# Patient Record
Sex: Female | Born: 1955
Health system: Southern US, Community
[De-identification: ages and names within clinical notes are randomized; demographics above are authoritative.]

## PROBLEM LIST (undated history)

## (undated) DIAGNOSIS — M1990 Unspecified osteoarthritis, unspecified site: Secondary | ICD-10-CM

## (undated) DIAGNOSIS — E0500 Thyrotoxicosis with diffuse goiter without thyrotoxic crisis or storm: Secondary | ICD-10-CM

## (undated) HISTORY — DX: Unspecified osteoarthritis, unspecified site: M19.90

## (undated) HISTORY — PX: SHOULDER SURGERY: SHX246

## (undated) HISTORY — DX: Thyrotoxicosis with diffuse goiter without thyrotoxic crisis or storm: E05.00

---

## 1992-10-06 HISTORY — PX: TUBAL LIGATION: SHX77

## 1998-04-16 ENCOUNTER — Other Ambulatory Visit: Admission: RE | Admit: 1998-04-16 | Discharge: 1998-04-16 | Payer: Self-pay | Admitting: Obstetrics and Gynecology

## 1999-05-20 ENCOUNTER — Other Ambulatory Visit: Admission: RE | Admit: 1999-05-20 | Discharge: 1999-05-20 | Payer: Self-pay | Admitting: Obstetrics and Gynecology

## 2000-06-23 ENCOUNTER — Other Ambulatory Visit: Admission: RE | Admit: 2000-06-23 | Discharge: 2000-06-23 | Payer: Self-pay | Admitting: Obstetrics and Gynecology

## 2001-06-28 ENCOUNTER — Other Ambulatory Visit: Admission: RE | Admit: 2001-06-28 | Discharge: 2001-06-28 | Payer: Self-pay | Admitting: Obstetrics and Gynecology

## 2002-06-30 ENCOUNTER — Other Ambulatory Visit: Admission: RE | Admit: 2002-06-30 | Discharge: 2002-06-30 | Payer: Self-pay | Admitting: Obstetrics and Gynecology

## 2003-07-04 ENCOUNTER — Other Ambulatory Visit: Admission: RE | Admit: 2003-07-04 | Discharge: 2003-07-04 | Payer: Self-pay | Admitting: Obstetrics and Gynecology

## 2004-07-05 ENCOUNTER — Other Ambulatory Visit: Admission: RE | Admit: 2004-07-05 | Discharge: 2004-07-05 | Payer: Self-pay | Admitting: Obstetrics and Gynecology

## 2004-12-02 ENCOUNTER — Encounter: Admission: RE | Admit: 2004-12-02 | Discharge: 2004-12-02 | Payer: Self-pay | Admitting: Orthopedic Surgery

## 2005-07-08 ENCOUNTER — Other Ambulatory Visit: Admission: RE | Admit: 2005-07-08 | Discharge: 2005-07-08 | Payer: Self-pay | Admitting: Obstetrics and Gynecology

## 2005-10-06 HISTORY — PX: COLONOSCOPY: SHX174

## 2006-08-18 ENCOUNTER — Ambulatory Visit: Payer: Self-pay | Admitting: Internal Medicine

## 2006-09-01 ENCOUNTER — Encounter (INDEPENDENT_AMBULATORY_CARE_PROVIDER_SITE_OTHER): Payer: Self-pay | Admitting: Specialist

## 2006-09-01 ENCOUNTER — Ambulatory Visit: Payer: Self-pay | Admitting: Internal Medicine

## 2009-11-17 ENCOUNTER — Emergency Department (HOSPITAL_BASED_OUTPATIENT_CLINIC_OR_DEPARTMENT_OTHER): Admission: EM | Admit: 2009-11-17 | Discharge: 2009-11-17 | Payer: Self-pay | Admitting: Emergency Medicine

## 2009-11-17 ENCOUNTER — Ambulatory Visit: Payer: Self-pay | Admitting: Diagnostic Radiology

## 2010-12-26 LAB — URINALYSIS, ROUTINE W REFLEX MICROSCOPIC
Bilirubin Urine: NEGATIVE
Glucose, UA: NEGATIVE mg/dL
Hgb urine dipstick: NEGATIVE
Nitrite: NEGATIVE
Specific Gravity, Urine: 1.023 (ref 1.005–1.030)
Urobilinogen, UA: 0.2 mg/dL (ref 0.0–1.0)

## 2010-12-26 LAB — BASIC METABOLIC PANEL
CO2: 26 mEq/L (ref 19–32)
Calcium: 9.6 mg/dL (ref 8.4–10.5)
Creatinine, Ser: 0.9 mg/dL (ref 0.4–1.2)
GFR calc Af Amer: >60 mL/min (ref >60)
GFR calc non Af Amer: >60 mL/min (ref >60)
Potassium: 4.4 mEq/L (ref 3.5–5.1)
Sodium: 140 mEq/L (ref 135–145)

## 2010-12-26 LAB — DIFFERENTIAL
Basophils Absolute: 0.2 10*3/uL — ABNORMAL HIGH (ref 0.0–0.1)
Basophils Relative: 2 % — ABNORMAL HIGH (ref 0–1)
Eosinophils Absolute: 0 10*3/uL (ref 0.0–0.7)
Eosinophils Relative: 0 % (ref 0–5)
Lymphs Abs: 0.2 10*3/uL — ABNORMAL LOW (ref 0.7–4.0)
Monocytes Absolute: 0.4 10*3/uL (ref 0.1–1.0)
Monocytes Relative: 3 % (ref 3–12)
Neutro Abs: 11.4 10*3/uL — ABNORMAL HIGH (ref 1.7–7.7)

## 2010-12-26 LAB — CBC
Hemoglobin: 13.4 g/dL (ref 12.0–15.0)
MCHC: 33.9 g/dL (ref 30.0–36.0)
RBC: 4.53 MIL/uL (ref 3.87–5.11)
WBC: 12.2 10*3/uL — ABNORMAL HIGH (ref 4.0–10.5)

## 2011-09-05 IMAGING — CT CT HEAD W/O CM
1 series · 16 of 30 positions shown, 20 images · non-contrast
Comparison: None

CLINICAL DATA: Syncope and dizziness

CT HEAD WITHOUT CONTRAST
TECHNIQUE: Contiguous axial images were obtained from the base of
the skull through the vertex without contrast.

[Series 2: head 4.8 h37s · axial · 0.44mm/px · z∈[+1090,+1223]mm · 16 of 32 slices shown, 20 images]
[im 2/32  brain]
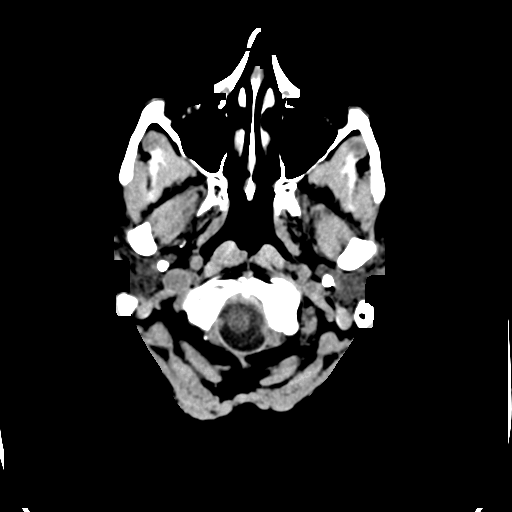
[im 2/32  bone]
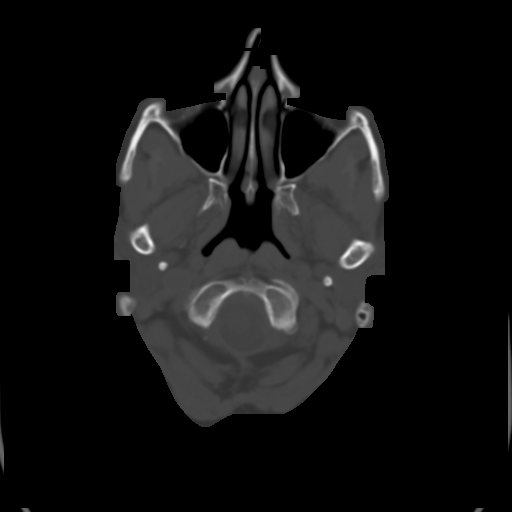
[im 4/32  brain]
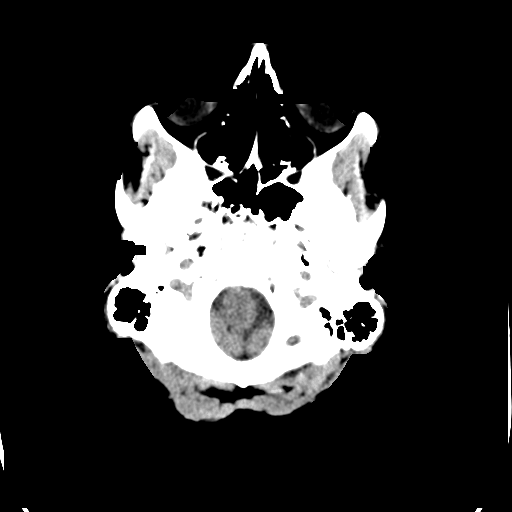
[im 6/32  brain]
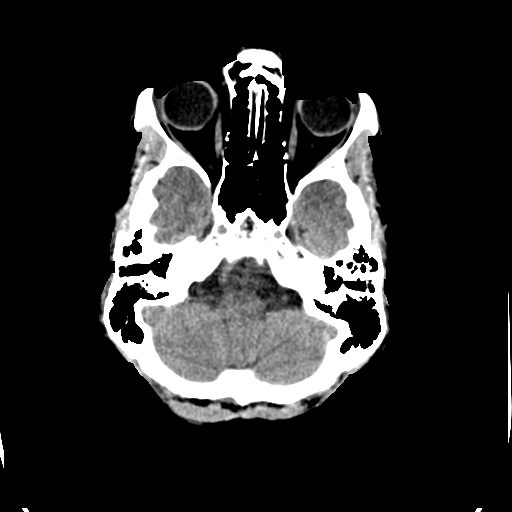
[im 8/32  brain]
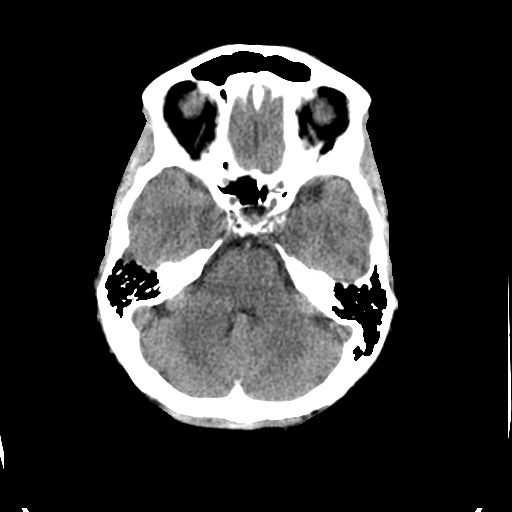
[im 9/32  brain]
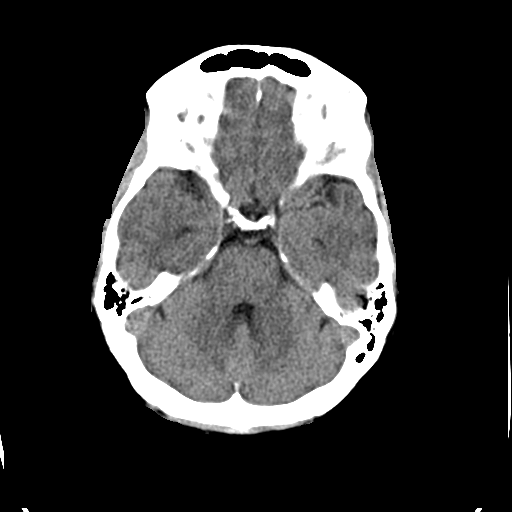
[im 9/32  bone]
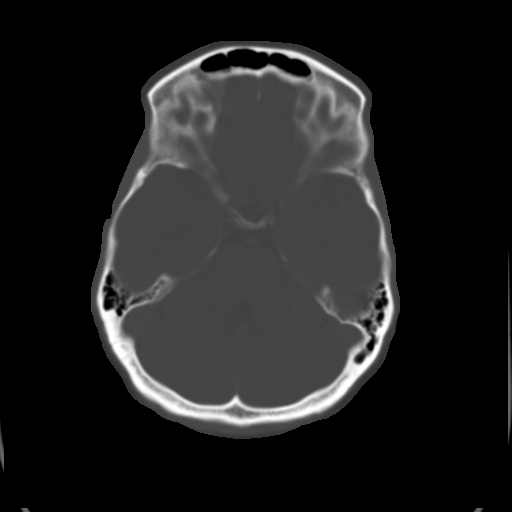
[im 11/32  brain]
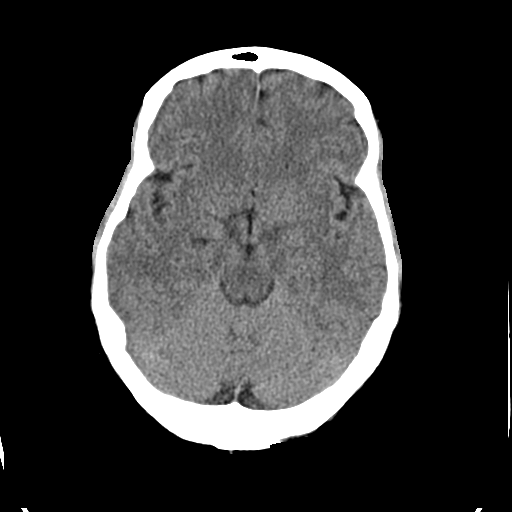
[im 13/32  brain]
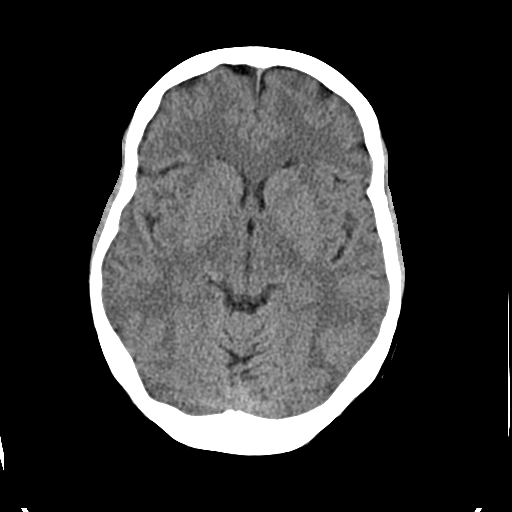
[im 15/32  brain]
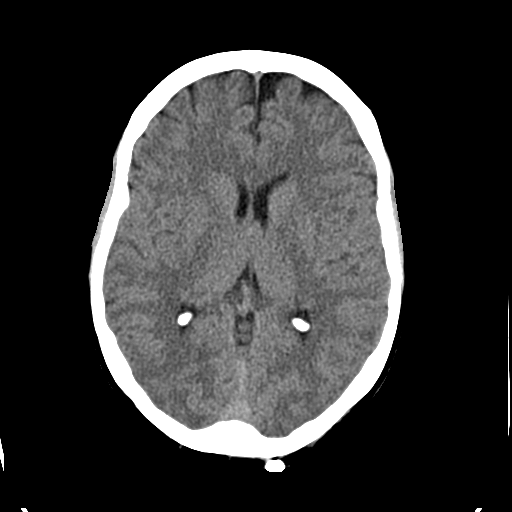
[im 17/32  brain]
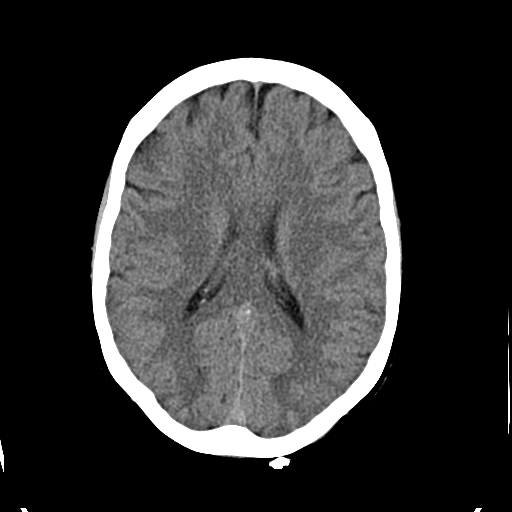
[im 17/32  bone]
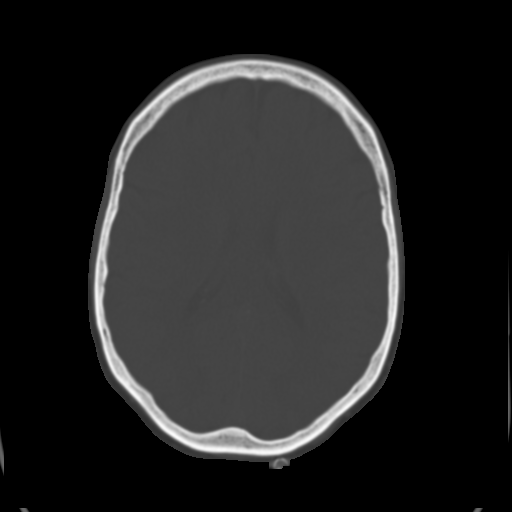
[im 19/32  brain]
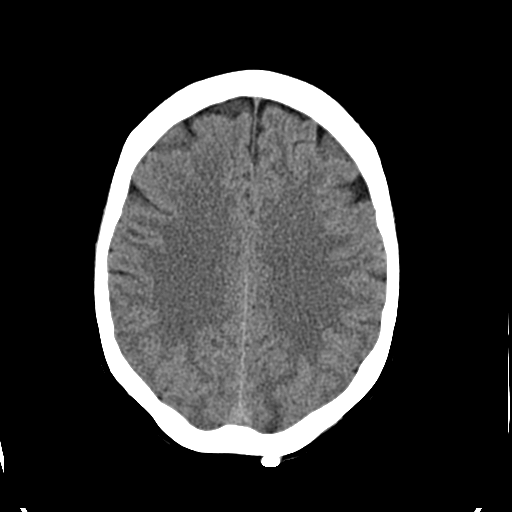
[im 21/32  brain]
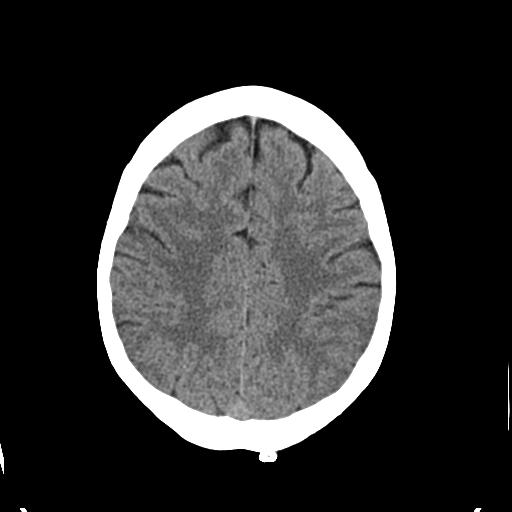
[im 23/32  brain]
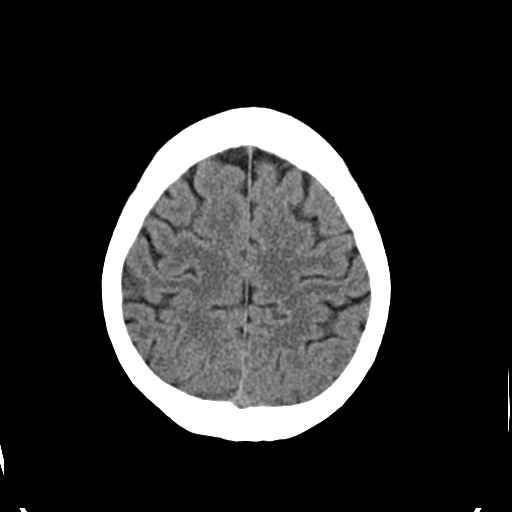
[im 24/32  brain]
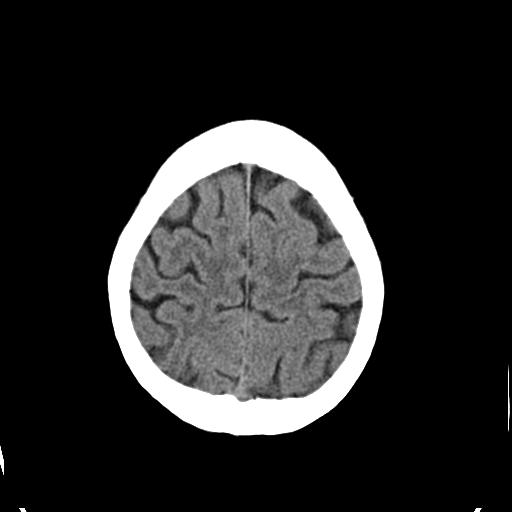
[im 24/32  bone]
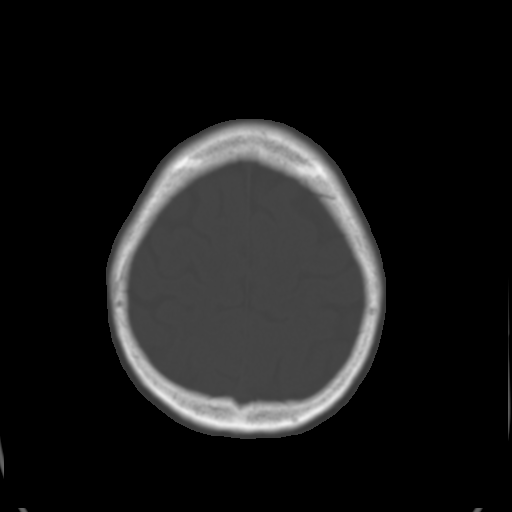
[im 26/32  brain]
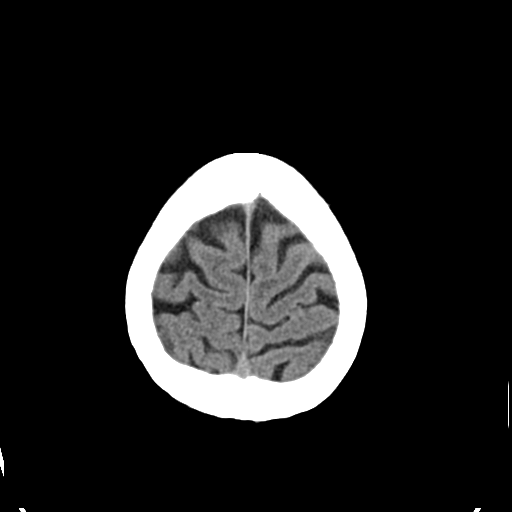
[im 28/32  brain]
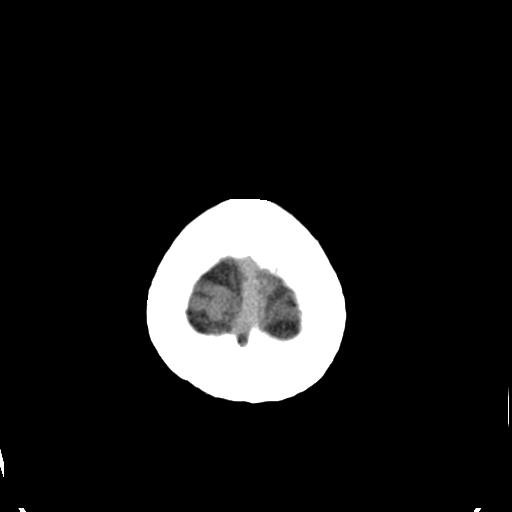
[im 30/32  brain]
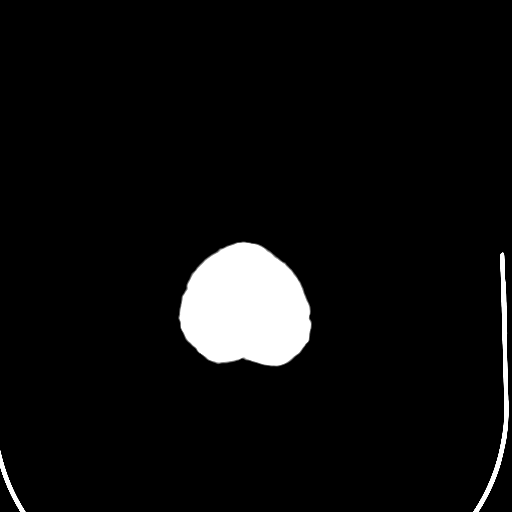

[16 of 30 positions shown; findings below may reference images not displayed]

FINDINGS: Vertical line of skin staples are present in the left
parietal scalp.  No scalp hematoma or subcutaneous gas is present.

There is no intracranial hemorrhage, hydrocephalus, mass effect,
midline shift, mass lesion, or evidence of acute infarction.

The skull is intact.  The visualized paranasal sinuses and mastoid
air cells are clear.
IMPRESSION: 1.  Normal CT of the brain.
2.  Skin staples in the left posterior scalp.

## 2011-09-11 ENCOUNTER — Other Ambulatory Visit: Payer: Self-pay | Admitting: Obstetrics and Gynecology

## 2013-07-06 ENCOUNTER — Encounter: Payer: Self-pay | Admitting: Internal Medicine

## 2013-08-31 ENCOUNTER — Encounter: Payer: Self-pay | Admitting: Internal Medicine

## 2013-09-21 ENCOUNTER — Other Ambulatory Visit: Payer: Self-pay | Admitting: Obstetrics and Gynecology

## 2013-10-27 ENCOUNTER — Ambulatory Visit (AMBULATORY_SURGERY_CENTER): Payer: Self-pay | Admitting: *Deleted

## 2013-10-27 VITALS — Ht 62.5 in | Wt 118.0 lb

## 2013-10-27 DIAGNOSIS — Z860100 Personal history of colon polyps, unspecified: Secondary | ICD-10-CM

## 2013-10-27 DIAGNOSIS — Z800 Family history of malignant neoplasm of digestive organs: Secondary | ICD-10-CM

## 2013-10-27 MED ORDER — MOVIPREP 100 G PO SOLR
1.0000 | Freq: Once | ORAL | Status: DC
Start: 2013-10-27 — End: 2013-11-11

## 2013-10-27 NOTE — Progress Notes (Signed)
Denies allergies to eggs or soy products. Denies complications with sedation or anesthesia. 

## 2013-11-11 ENCOUNTER — Encounter: Payer: Self-pay | Admitting: Internal Medicine

## 2013-11-11 ENCOUNTER — Ambulatory Visit (AMBULATORY_SURGERY_CENTER): Payer: No Typology Code available for payment source | Admitting: Internal Medicine

## 2013-11-11 VITALS — BP 115/70 | HR 65 | Temp 97.2°F | Resp 14 | Ht 62.5 in | Wt 118.0 lb

## 2013-11-11 DIAGNOSIS — Z800 Family history of malignant neoplasm of digestive organs: Secondary | ICD-10-CM

## 2013-11-11 DIAGNOSIS — Z860100 Personal history of colon polyps, unspecified: Secondary | ICD-10-CM

## 2013-11-11 DIAGNOSIS — Z1211 Encounter for screening for malignant neoplasm of colon: Secondary | ICD-10-CM

## 2013-11-11 MED ORDER — SODIUM CHLORIDE 0.9 % IV SOLN: 500.0000 mL | INTRAVENOUS | Status: DC

## 2013-11-11 NOTE — Progress Notes (Signed)
Pt HR decreased to NSB 40 's with Colonoscopy- Atropine IV given per documentation in increments.Dr Olevia Perches aware. Immediate increase to NSR 60-70. Stable

## 2013-11-11 NOTE — Op Note (Signed)
[place]  [address] [place], [id number]   COLONOSCOPY PROCEDURE REPORT  PATIENT: Dawn Burns, Dawn Burns  MR#: [id number] BIRTHDATE: January 23, 1956 , 72  yrs. old GENDER: Female ENDOSCOPIST: Lafayette Dragon, MD REFERRED FB:[id number] Maceo Pro, M.D. PROCEDURE DATE:  11/11/2013 PROCEDURE:   Colonoscopy, screening First Screening Colonoscopy - Above ane.  risk and is 50 yrs.  old or older - No.  Prior Negative Screening - Now for repeat screening.  N/A  History of Adenoma - Now for follow-up colonoscopy & has been > or = to 3 yrs.  N/A  Polyps Removed Today? No. Recommend repeat exam, <10 yrs? No. ASA CLASS:   Class II INDICATIONS:hyperplastic polyp on colonoscopy in November 2007. Family history of colon cancer and Patient's immediate family history of colon cancer.in father MEDICATIONS: MAC sedation, administered by CRNA and propofol (Diprivan) 200mg  IV  DESCRIPTION OF PROCEDURE:   After the risks benefits and alternatives of the procedure were thoroughly explained, informed consent was obtained.  A digital rectal exam revealed no abnormalities of the rectum.   The LB [id number]  endoscope was introduced through the anus and advanced to the cecum, which was identified by both the appendix and ileocecal valve. No adverse events experienced.   The quality of the prep was good, using MoviPrep  The instrument was then slowly withdrawn as the colon was fully examined.      COLON FINDINGS: A normal appearing cecum, ileocecal valve, and appendiceal orifice were identified.  The ascending, hepatic flexure, transverse, splenic flexure, descending, sigmoid colon and rectum appeared unremarkable.  No polyps or cancers were seen. Retroflexed views revealed no abnormalities. The time to cecum=10 minutes 38 seconds.  Withdrawal time=6 minutes 08 seconds.  The scope was withdrawn and the procedure completed. COMPLICATIONS: There were no complications.  ENDOSCOPIC  IMPRESSION: Normal colon  RECOMMENDATIONS: high fiber diet Recall colonoscopy in 5 years   eSigned:  Lafayette Dragon, MD 11/11/2013 8:39 AM   cc:

## 2013-11-11 NOTE — Patient Instructions (Signed)
YOU HAD AN ENDOSCOPIC PROCEDURE TODAY AT THE [facility]: Refer to the procedure report that was given to you for any specific questions about what was found during the examination.  If the procedure report does not answer your questions, please call your gastroenterologist to clarify.  If you requested that your care partner not be given the details of your procedure findings, then the procedure report has been included in a sealed envelope for you to review at your convenience later.  YOU SHOULD EXPECT: Some feelings of bloating in the abdomen. Passage of more gas than usual.  Walking can help get rid of the air that was put into your GI tract during the procedure and reduce the bloating. If you had a lower endoscopy (such as a colonoscopy or flexible sigmoidoscopy) you may notice spotting of blood in your stool or on the toilet paper. If you underwent a bowel prep for your procedure, then you may not have a normal bowel movement for a few days.  DIET: Your first meal following the procedure should be a light meal and then it is ok to progress to your normal diet.  A half-sandwich or bowl of soup is an example of a good first meal.  Heavy or fried foods are harder to digest and may make you feel nauseous or bloated.  Likewise meals heavy in dairy and vegetables can cause extra gas to form and this can also increase the bloating.  Drink plenty of fluids but you should avoid alcoholic beverages for 24 hours.  ACTIVITY: Your care partner should take you home directly after the procedure.  You should plan to take it easy, moving slowly for the rest of the day.  You can resume normal activity the day after the procedure however you should NOT DRIVE or use heavy machinery for 24 hours (because of the sedation medicines used during the test).    SYMPTOMS TO REPORT IMMEDIATELY: A gastroenterologist can be reached at any hour.  During normal business hours, 8:30 AM to 5:00 PM Monday through Friday,  call [phone].  After hours and on weekends, please call the GI answering service at [phone] who will take a message and have the physician on call contact you.   Following lower endoscopy (colonoscopy or flexible sigmoidoscopy):  Excessive amounts of blood in the stool  Significant tenderness or worsening of abdominal pains  Swelling of the abdomen that is new, acute  Fever of 100F or higher    FOLLOW UP: If any biopsies were taken you will be contacted by phone or by letter within the next 1-3 weeks.  Call your gastroenterologist if you have not heard about the biopsies in 3 weeks.  Our staff will call the home number listed on your records the next business day following your procedure to check on you and address any questions or concerns that you may have at that time regarding the information given to you following your procedure. This is a courtesy call and so if there is no answer at the home number and we have not heard from you through the emergency physician on call, we will assume that you have returned to your regular daily activities without incident.  SIGNATURES/CONFIDENTIALITY: You and/or your care partner have signed paperwork which will be entered into your electronic medical record.  These signatures attest to the fact that that the information above on your After Visit Summary has been reviewed and is understood.  Full responsibility of the confidentiality   of this discharge information lies with you and/or your care-partner.     

## 2013-11-14 ENCOUNTER — Telehealth: Payer: Self-pay | Admitting: *Deleted

## 2013-11-14 NOTE — Telephone Encounter (Signed)
  Follow up Call-  Call back number 11/11/2013  Post procedure Call Back phone  # [phone]  Permission to leave phone message Yes     Patient questions:  Message left to call if necessary.

## 2014-09-26 ENCOUNTER — Other Ambulatory Visit: Payer: Self-pay | Admitting: Obstetrics and Gynecology

## 2014-09-27 LAB — CYTOLOGY - PAP

## 2015-06-19 ENCOUNTER — Telehealth: Payer: Self-pay | Admitting: *Deleted

## 2015-06-19 NOTE — Telephone Encounter (Signed)
Patient brought in positive "Insure FIT" results to our office. Patient is a former Dr Dawn Burns patient. Since Dr Dawn Burns has retired, we will have patient see Dr [name] 08/15/15. Patient verbalizes understanding of this.

## 2015-08-15 ENCOUNTER — Ambulatory Visit (INDEPENDENT_AMBULATORY_CARE_PROVIDER_SITE_OTHER): Payer: No Typology Code available for payment source | Admitting: Gastroenterology

## 2015-08-15 ENCOUNTER — Encounter: Payer: Self-pay | Admitting: Gastroenterology

## 2015-08-15 VITALS — BP 98/64 | Ht 62.0 in | Wt 115.8 lb

## 2015-08-15 DIAGNOSIS — R195 Other fecal abnormalities: Secondary | ICD-10-CM

## 2015-08-15 DIAGNOSIS — Z800 Family history of malignant neoplasm of digestive organs: Secondary | ICD-10-CM

## 2015-08-15 DIAGNOSIS — R12 Heartburn: Secondary | ICD-10-CM | POA: Diagnosis not present

## 2015-08-15 MED ORDER — NA SULFATE-K SULFATE-MG SULF 17.5-3.13-1.6 GM/177ML PO SOLN: 1.0000 | Freq: Once | ORAL | Status: DC

## 2015-08-15 NOTE — Progress Notes (Signed)
Dawn Burns    [id number]    1956/01/17  Primary Care Physician:FRIED, Jaymes Graff, MD  Referring Physician: Briscoe Deutscher, MD [address] [place],  [address]  Chief complaint:  + fecal immunochemical test (FIT)  HPI: 59 year old female here for visit with positive fecal Hemoccult test. Patient denies any complaints of constipation, diarrhea, melena or bright red blood per rectum. She does have occasional heartburn and takes over-the-counter Tums for it. No dysphagia, nausea or vomiting. Her father was diagnosed with colon cancer in his 52s and she has a cousin with stage IV colon cancer. Her mother has history of duodenal ulcers. Her weights been stable and she has no other GI related complaint   Outpatient Encounter Prescriptions as of 08/15/2015  Medication Sig  . aspirin 81 MG tablet Take 81 mg by mouth daily.  . cholecalciferol (VITAMIN D) 1000 UNITS tablet Take 1,000 Units by mouth daily.  Marland Kitchen levothyroxine (SYNTHROID, LEVOTHROID) 75 MCG tablet Take 75 mcg by mouth daily before breakfast.  . Magnesium 250 MG TABS Take by mouth daily.  . Multiple Vitamins-Minerals (MULTIVITAMIN PO) Take by mouth.  . Omega-3 Fatty Acids (FISH OIL PO) Take by mouth.  . [DISCONTINUED] glucosamine-chondroitin 500-400 MG tablet Take 1 tablet by mouth 3 (three) times daily.  . [DISCONTINUED] KRILL OIL PO Take by mouth.  . Na Sulfate-K Sulfate-Mg Sulf (SUPREP BOWEL PREP) SOLN Take 1 kit by mouth once.   No facility-administered encounter medications on file as of 08/15/2015.    Allergies as of 08/15/2015 - Review Complete 08/15/2015  Allergen Reaction Noted  . Penicillins Rash 10/27/2013  . Sulfa antibiotics Rash 10/27/2013    Past Medical History  Diagnosis Date  . Graves disease     Past Surgical History  Procedure Laterality Date  . Tubal ligation  1994  . Colonoscopy  2007    Family History  Problem Relation Age of Onset  . Colon cancer Father   .  Esophageal cancer Neg Hx   . Rectal cancer Neg Hx   . Stomach cancer Neg Hx     Social History   Social History  . Marital Status: Married    Spouse Name: N/A  . Number of Children: N/A  . Years of Education: N/A   Occupational History  . Not on file.   Social History Main Topics  . Smoking status: Never Smoker   . Smokeless tobacco: Never Used  . Alcohol Use: No  . Drug Use: No  . Sexual Activity: Not on file   Other Topics Concern  . Not on file   Social History Narrative      Review of systems: Review of Systems  Constitutional: Negative for fever and chills.  HENT: Negative.   Eyes: Negative for blurred vision.  Respiratory: Negative for cough, shortness of breath and wheezing.   Cardiovascular: Negative for chest pain and palpitations.  Gastrointestinal: as per HPI Genitourinary: Negative for dysuria, urgency, frequency and hematuria.  Musculoskeletal: Negative for myalgias, back pain and joint pain.  Skin: Negative for itching and rash.  Neurological: Negative for dizziness, tremors, focal weakness, seizures and loss of consciousness.  Endo/Heme/Allergies: Negative for environmental allergies.  Psychiatric/Behavioral: Negative for depression, suicidal ideas and hallucinations.  All other systems reviewed and are negative.   Physical Exam: Filed Vitals:   08/15/15 0921  BP: 98/64   Gen:      No acute distress HEENT:  EOMI, sclera  anicteric Neck:     No masses; no thyromegaly Lungs:    Clear to auscultation bilaterally; normal respiratory effort CV:         Regular rate and rhythm; no murmurs Abd:      + bowel sounds; soft, non-tender; no palpable masses, no distension Ext:    No edema; adequate peripheral perfusion Skin:      Warm and dry; no rash Neuro: alert and oriented x 3 Psych: normal mood and affect  Data Reviewed: Colonoscopy 2007: sigmoid hyperplastic polyp Colonoscopy 2015: Normal   Assessment and Plan/Recommendations:  59 year old  female with positive fecal immunochemical test We'll schedule for EGD and colonoscopy for evaluation, she has positive family history of colon cancer Father in 45s Return after the procedures  K. Denzil Magnuson , MD [phone] Mon-Fri 8a-5p [phone] after 5p, weekends, holidays

## 2015-08-15 NOTE — Patient Instructions (Addendum)
You have been scheduled for an endoscopy and colonoscopy. Please follow the written instructions given to you at your visit today. Please pick up your prep supplies at the pharmacy within the next 1-3 days. If you use inhalers (even only as needed), please bring them with you on the day of your procedure. Your physician has requested that you go to www.startemmi.com and enter the access code given to you at your visit today. This web site gives a general overview about your procedure. However, you should still follow specific instructions given to you by our office regarding your preparation for the procedure.  Follow up in 4 months

## 2015-10-02 ENCOUNTER — Other Ambulatory Visit: Payer: Self-pay | Admitting: Obstetrics and Gynecology

## 2015-10-03 LAB — CYTOLOGY - PAP

## 2015-10-25 ENCOUNTER — Ambulatory Visit (AMBULATORY_SURGERY_CENTER): Payer: No Typology Code available for payment source | Admitting: Gastroenterology

## 2015-10-25 ENCOUNTER — Encounter: Payer: Self-pay | Admitting: Gastroenterology

## 2015-10-25 VITALS — BP 109/69 | HR 63 | Temp 96.5°F | Resp 30 | Ht 62.0 in | Wt 115.0 lb

## 2015-10-25 DIAGNOSIS — D122 Benign neoplasm of ascending colon: Secondary | ICD-10-CM | POA: Diagnosis not present

## 2015-10-25 DIAGNOSIS — R195 Other fecal abnormalities: Secondary | ICD-10-CM | POA: Diagnosis not present

## 2015-10-25 DIAGNOSIS — R12 Heartburn: Secondary | ICD-10-CM

## 2015-10-25 DIAGNOSIS — D125 Benign neoplasm of sigmoid colon: Secondary | ICD-10-CM

## 2015-10-25 MED ORDER — SODIUM CHLORIDE 0.9 % IV SOLN
500.0000 mL | INTRAVENOUS | Status: DC
Start: 2015-10-25 — End: 2015-10-25

## 2015-10-25 NOTE — Op Note (Signed)
[place]  [address]. [place], [id number]   ENDOSCOPY PROCEDURE REPORT  PATIENT: Dawn Burns, Dawn Burns  MR#: [id number] BIRTHDATE: 01-Jan-1956 , 59  yrs. old GENDER: female ENDOSCOPIST: Harl Bowie, MD REFERRED BY:  Briscoe Deutscher, M.D. PROCEDURE DATE:  10/25/2015 PROCEDURE:  EGD, diagnostic ASA CLASS:     Class II INDICATIONS:  heartburn and Heme occult positive FIT test positive. MEDICATIONS: Propofol 150 mg IV TOPICAL ANESTHETIC: none  DESCRIPTION OF PROCEDURE: After the risks benefits and alternatives of the procedure were thoroughly explained, informed consent was obtained.  The LB [id number] endoscope was introduced through the mouth and advanced to the second portion of the duodenum , Without limitations.  The instrument was slowly withdrawn as the mucosa was fully examined.   EXAM: The esophagus was normal except for mild erosive esophagitis near gastroesophageal junction. The stomach was entered and closely examined.The antrum, angularis, and lesser curvature were well visualized, including a retroflexed view of the cardia and fundus. The stomach wall was normally distensable.  The scope passed easily through the pylorus into the duodenum.  Retroflexed views revealed no abnormalities.     The scope was then withdrawn from the patient and the procedure completed.  COMPLICATIONS: There were no immediate complications.  ENDOSCOPIC IMPRESSION: LA grade a erosive esophagitis otherwise normal exam.  RECOMMENDATIONS: 1.  Anti-reflux regimen to be follow 2.  avoid NSAID's 3.  Proceed with a Colonoscopy.    eSigned:  Harl Bowie, MD 10/25/2015 12:07 PM

## 2015-10-25 NOTE — Progress Notes (Signed)
Report to PACU, RN, vss, BBS= Clear.  

## 2015-10-25 NOTE — Patient Instructions (Signed)
YOU HAD AN ENDOSCOPIC PROCEDURE TODAY AT [facility]:   Refer to the procedure report that was given to you for any specific questions about what was found during the examination.  If the procedure report does not answer your questions, please call your gastroenterologist to clarify.  If you requested that your care partner not be given the details of your procedure findings, then the procedure report has been included in a sealed envelope for you to review at your convenience later.  YOU SHOULD EXPECT: Some feelings of bloating in the abdomen. Passage of more gas than usual.  Walking can help get rid of the air that was put into your GI tract during the procedure and reduce the bloating. If you had a lower endoscopy (such as a colonoscopy or flexible sigmoidoscopy) you may notice spotting of blood in your stool or on the toilet paper. If you underwent a bowel prep for your procedure, you may not have a normal bowel movement for a few days.  Please Note:  You might notice some irritation and congestion in your nose or some drainage.  This is from the oxygen used during your procedure.  There is no need for concern and it should clear up in a day or so.  SYMPTOMS TO REPORT IMMEDIATELY:   Following lower endoscopy (colonoscopy or flexible sigmoidoscopy):  Excessive amounts of blood in the stool  Significant tenderness or worsening of abdominal pains  Swelling of the abdomen that is new, acute  Fever of 100F or higher  YOU HAD AN ENDOSCOPIC PROCEDURE TODAY AT [facility]:   Refer to the procedure report that was given to you for any specific questions about what was found during the examination.  If the procedure report does not answer your questions, please call your gastroenterologist to clarify.  If you requested that your care partner not be given the details of your procedure findings, then the procedure report has been included in a sealed envelope for you to  review at your convenience later.  YOU SHOULD EXPECT: Some feelings of bloating in the abdomen. Passage of more gas than usual.  Walking can help get rid of the air that was put into your GI tract during the procedure and reduce the bloating. If you had a lower endoscopy (such as a colonoscopy or flexible sigmoidoscopy) you may notice spotting of blood in your stool or on the toilet paper. If you underwent a bowel prep for your procedure, you may not have a normal bowel movement for a few days.  Please Note:  You might notice some irritation and congestion in your nose or some drainage.  This is from the oxygen used during your procedure.  There is no need for concern and it should clear up in a day or so.  SYMPTOMS TO REPORT IMMEDIATELY:   Following lower endoscopy (colonoscopy or flexible sigmoidoscopy):  Excessive amounts of blood in the stool  Significant tenderness or worsening of abdominal pains  Swelling of the abdomen that is new, acute  Fever of 100F or higher   Following upper endoscopy (EGD)  Vomiting of blood or coffee ground material  New chest pain or pain under the shoulder blades  Painful or persistently difficult swallowing  New shortness of breath  Fever of 100F or higher  Black, tarry-looking stools  For urgent or emergent issues, a gastroenterologist can be reached at any hour by calling [phone].   DIET: Your first meal following the procedure  should be a small meal and then it is ok to progress to your normal diet. Heavy or fried foods are harder to digest and may make you feel nauseous or bloated.  Likewise, meals heavy in dairy and vegetables can increase bloating.  Drink plenty of fluids but you should avoid alcoholic beverages for 24 hours.  ACTIVITY:  You should plan to take it easy for the rest of today and you should NOT DRIVE or use heavy machinery until tomorrow (because of the sedation medicines used during the test).    FOLLOW UP: Our staff  will call the number listed on your records the next business day following your procedure to check on you and address any questions or concerns that you may have regarding the information given to you following your procedure. If we do not reach you, we will leave a message.  However, if you are feeling well and you are not experiencing any problems, there is no need to return our call.  We will assume that you have returned to your regular daily activities without incident.  If any biopsies were taken you will be contacted by phone or by letter within the next 1-3 weeks.  Please call us at [phone] if you have not heard about the biopsies in 3 weeks.    SIGNATURES/CONFIDENTIALITY: You and/or your care partner have signed paperwork which will be entered into your electronic medical record.  These signatures attest to the fact that that the information above on your After Visit Summary has been reviewed and is understood.  Full responsibility of the confidentiality of this discharge information lies with you and/or your care-partner. For urgent or emergent issues, a gastroenterologist can be reached at any hour by calling [phone].   DIET: Your first meal following the procedure should be a small meal and then it is ok to progress to your normal diet. Heavy or fried foods are harder to digest and may make you feel nauseous or bloated.  Likewise, meals heavy in dairy and vegetables can increase bloating.  Drink plenty of fluids but you should avoid alcoholic beverages for 24 hours.  ACTIVITY:  You should plan to take it easy for the rest of today and you should NOT DRIVE or use heavy machinery until tomorrow (because of the sedation medicines used during the test).    FOLLOW UP: Our staff will call the number listed on your records the next business day following your procedure to check on you and address any questions or concerns that you may have regarding the information given to you  following your procedure. If we do not reach you, we will leave a message.  However, if you are feeling well and you are not experiencing any problems, there is no need to return our call.  We will assume that you have returned to your regular daily activities without incident.  If any biopsies were taken you will be contacted by phone or by letter within the next 1-3 weeks.  Please call us at [phone] if you have not heard about the biopsies in 3 weeks.    SIGNATURES/CONFIDENTIALITY: You and/or your care partner have signed paperwork which will be entered into your electronic medical record.  These signatures attest to the fact that that the information above on your After Visit Summary has been reviewed and is understood.  Full responsibility of the confidentiality of this discharge information lies with you and/or your care-partner.  POLYP handout given Avoid NSAIDS's  for 2 weeks

## 2015-10-25 NOTE — Op Note (Signed)
[place]  [address] [place], [id number]   COLONOSCOPY PROCEDURE REPORT  PATIENT: Dawn Burns, Dawn Burns  MR#: [id number] BIRTHDATE: [birth year] , 59  yrs. old GENDER: female ENDOSCOPIST: Harl Bowie, MD REFERRED [id number] Maceo Pro, M.D. PROCEDURE DATE:  10/25/2015 PROCEDURE:   Colonoscopy, screening, Colonoscopy with cold biopsy polypectomy, and Colonoscopy with snare polypectomy First Screening Colonoscopy - Avg.  risk and is 50 yrs.  old or older Yes.  Prior Negative Screening - Now for repeat screening. N/A  History of Adenoma - Now for follow-up colonoscopy & has been > or = to 3 yrs.  N/A  Polyps removed today? Yes ASA CLASS:   Class II INDICATIONS:Screening for colonic neoplasia and Colorectal Neoplasm Risk Assessment for this procedure is average risk. MEDICATIONS: Propofol 150 mg IV  DESCRIPTION OF PROCEDURE:   After the risks benefits and alternatives of the procedure were thoroughly explained, informed consent was obtained.  The digital rectal exam revealed no abnormalities of the rectum.   The LB [id number]  endoscope was introduced through the anus and advanced to the cecum, which was identified by both the appendix and ileocecal valve. No adverse events experienced.   The quality of the prep was good.  The instrument was then slowly withdrawn as the colon was fully examined. Estimated blood loss is zero unless otherwise noted in this procedure report.   COLON FINDINGS: Two sessile polyps ranging between 5-65mm in size were found in the ascending colon.  Polypectomies were performed using snare cautery.  The resection was complete, the polyp tissue was completely retrieved and sent to histology.   Two sessile polyps ranging between 3-43mm in size were found in the sigmoid colon.  A polypectomy was performed with cold forceps.  The resection was complete, the polyp tissue was completely retrieved and sent to histology.  Retroflexed views  revealed internal hemorrhoids. The time to cecum = 12.3 Withdrawal time = 11.1   The scope was withdrawn and the procedure completed. COMPLICATIONS: There were no immediate complications.  ENDOSCOPIC IMPRESSION: 1.   Two sessile polyps ranging between 5-78mm in size were found in the ascending colon; polypectomies were performed using snare cautery 2.   Two sessile polyps ranging between 3-7mm in size were found in the sigmoid colon; polypectomy was performed with cold forceps  RECOMMENDATIONS: 1.  Await pathology results 2.  If the polyp(s) removed today are proven to be adenomatous (pre-cancerous) polyps, you will need a repeat colonoscopy in 5 years.  Otherwise you should continue to follow colorectal cancer screening guidelines for "routine risk" patients with colonoscopy in 10 years.  You will receive a letter within 1-2 weeks with the results of your biopsy as well as final recommendations.  Please call my office if you have not received a letter after 3 weeks.  eSigned:  Harl Bowie, MD 10/25/2015 12:03 PM

## 2015-10-25 NOTE — Progress Notes (Signed)
Called to room to assist during endoscopic procedure.  Patient ID and intended procedure confirmed with present staff. Received instructions for my participation in the procedure from the performing physician.  

## 2015-10-26 ENCOUNTER — Telehealth: Payer: Self-pay | Admitting: *Deleted

## 2015-10-26 NOTE — Telephone Encounter (Signed)
  Follow up Call-  Call back number 10/25/2015 11/11/2013  Post procedure Call Back phone  # [phone]  Permission to leave phone message Yes Yes     Patient questions:  Do you have a fever, pain , or abdominal swelling? No. Pain Score  0 *  Have you tolerated food without any problems? Yes.    Have you been able to return to your normal activities? Yes.    Do you have any questions about your discharge instructions: Diet   No. Medications  No. Follow up visit  No.  Do you have questions or concerns about your Care? No.  Actions: * If pain score is 4 or above: No action needed, pain <4. Patient states she has had a runny nose since the procedure. Referred the patient to discharge instructions, related to 02.

## 2015-11-07 ENCOUNTER — Encounter: Payer: Self-pay | Admitting: Gastroenterology

## 2016-10-07 ENCOUNTER — Other Ambulatory Visit: Payer: Self-pay | Admitting: Obstetrics and Gynecology

## 2016-10-08 LAB — CYTOLOGY - PAP

## 2020-04-04 ENCOUNTER — Other Ambulatory Visit: Payer: No Typology Code available for payment source

## 2020-04-04 ENCOUNTER — Ambulatory Visit (INDEPENDENT_AMBULATORY_CARE_PROVIDER_SITE_OTHER): Payer: No Typology Code available for payment source | Admitting: Gastroenterology

## 2020-04-04 ENCOUNTER — Encounter: Payer: Self-pay | Admitting: Gastroenterology

## 2020-04-04 VITALS — BP 112/60 | HR 64 | Ht 62.0 in | Wt 117.0 lb

## 2020-04-04 DIAGNOSIS — R197 Diarrhea, unspecified: Secondary | ICD-10-CM | POA: Diagnosis not present

## 2020-04-04 MED ORDER — COLESTIPOL HCL 1 G PO TABS: 1.0000 g | ORAL_TABLET | Freq: Every day | ORAL | 3 refills | Status: DC

## 2020-04-04 NOTE — Patient Instructions (Addendum)
We have sent Colestid to your pharmacy  Go to the basement for labs today   Follow up in 3 months   Lactose-Free Diet, Adult If you have lactose intolerance, you are not able to digest lactose. Lactose is a natural sugar found mainly in dairy milk and dairy products. You may need to avoid all foods and beverages that contain lactose. A lactose-free diet can help you do this. Which foods have lactose? Lactose is found in dairy milk and dairy products, such as:  Yogurt.  Cheese.  Butter.  Margarine.  Sour cream.  Cream.  Whipped toppings and nondairy creamers.  Ice cream and other dairy-based desserts. Lactose is also found in foods or products made with dairy milk or milk ingredients. To find out whether a food contains dairy milk or a milk ingredient, look at the ingredients list. Avoid foods with the statement "May contain milk" and foods that contain:  Milk powder.  Whey.  Curd.  Caseinate.  Lactose.  Lactalbumin.  Lactoglobulin. What are alternatives to dairy milk and foods made with milk products?  Lactose-free milk.  Soy milk with added calcium and vitamin D.  Almond milk, coconut milk, rice milk, or other nondairy milk alternatives with added calcium and vitamin D. Note that these are low in protein.  Soy products, such as soy yogurt, soy cheese, soy ice cream, and soy-based sour cream.  Other nut milk products, such as almond yogurt, almond cheese, cashew yogurt, cashew cheese, cashew ice cream, coconut yogurt, and coconut ice cream. What are tips for following this plan?  Do not consume foods, beverages, vitamins, minerals, or medicines containing lactose. Read ingredient lists carefully.  Look for the words "lactose-free" on labels.  Use lactase enzyme drops or tablets as directed by your health care provider.  Use lactose-free milk or a milk alternative, such as soy milk or almond milk, for drinking and cooking.  Make sure you get enough  calcium and vitamin D in your diet. A lactose-free eating plan can be lacking in these important nutrients.  Take calcium and vitamin D supplements as directed by your health care provider. Talk to your health care provider about supplements if you are not able to get enough calcium and vitamin D from food. What foods can I eat?  Fruits All fresh, canned, frozen, or dried fruits that are not processed with lactose. Vegetables All fresh, frozen, and canned vegetables without cheese, cream, or butter sauces. Grains Any that are not made with dairy milk or dairy products. Meats and other proteins Any meat, fish, poultry, and other protein sources that are not made with dairy milk or dairy products. Soy cheese and yogurt. Fats and oils Any that are not made with dairy milk or dairy products. Beverages Lactose-free milk. Soy, rice, or almond milk with added calcium and vitamin D. Fruit and vegetable juices. Sweets and desserts Any that are not made with dairy milk or dairy products. Seasonings and condiments Any that are not made with dairy milk or dairy products. Calcium Calcium is found in many foods that contain lactose and is important for bone health. The amount of calcium you need depends on your age:  Adults younger than 50 years: 1,000 mg of calcium a day.  Adults older than 50 years: 1,200 mg of calcium a day. If you are not getting enough calcium, you may get it from other sources, including:  Orange juice with calcium added. There are 300-350 mg of calcium in 1 cup of orange  juice.  Calcium-fortified soy milk. There are 300-400 mg of calcium in 1 cup of calcium-fortified soy milk.  Calcium-fortified rice or almond milk. There are 300 mg of calcium in 1 cup of calcium-fortified rice or almond milk.  Calcium-fortified breakfast cereals. There are 100-1,000 mg of calcium in calcium-fortified breakfast cereals.  Spinach, cooked. There are 145 mg of calcium in  cup of cooked  spinach.  Edamame, cooked. There are 130 mg of calcium in  cup of cooked edamame.  Collard greens, cooked. There are 125 mg of calcium in  cup of cooked collard greens.  Kale, frozen or cooked. There are 90 mg of calcium in  cup of cooked or frozen kale.  Almonds. There are 95 mg of calcium in  cup of almonds.  Broccoli, cooked. There are 60 mg of calcium in 1 cup of cooked broccoli. The items listed above may not be a complete list of recommended foods and beverages. Contact a dietitian for more options. What foods are not recommended? Fruits None, unless they are made with dairy milk or dairy products. Vegetables None, unless they are made with dairy milk or dairy products. Grains Any grains that are made with dairy milk or dairy products. Meats and other proteins None, unless they are made with dairy milk or dairy products. Dairy All dairy products, including milk, goat's milk, buttermilk, kefir, acidophilus milk, flavored milk, evaporated milk, condensed milk, dulce de [place], eggnog, yogurt, cheese, and cheese spreads. Fats and oils Any that are made with milk or milk products. Margarines and salad dressings that contain milk or cheese. Cream. Half and half. Cream cheese. Sour cream. Chip dips made with sour cream or yogurt. Beverages Hot chocolate. Cocoa with lactose. Instant iced teas. Powdered fruit drinks. Smoothies made with dairy milk or yogurt. Sweets and desserts Any that are made with milk or milk products. Seasonings and condiments Chewing gum that has lactose. Spice blends if they contain lactose. Artificial sweeteners that contain lactose. Nondairy creamers. The items listed above may not be a complete list of foods and beverages to avoid. Contact a dietitian for more information. Summary  If you are lactose intolerant, it means that you have a hard time digesting lactose, a natural sugar found in milk and milk products.  Following a lactose-free diet can help  you manage this condition.  Calcium is important for bone health and is found in many foods that contain lactose. Talk with your health care provider about other sources of calcium. This information is not intended to replace advice given to you by your health care provider. Make sure you discuss any questions you have with your health care provider. Document Revised: 10/20/2017 Document Reviewed: 10/20/2017 Elsevier Patient Education  [place].   I appreciate the  opportunity to care for you  Thank You   Harl Bowie , MD

## 2020-04-04 NOTE — Progress Notes (Signed)
Dawn Burns    [id number]    1956/05/16  Primary Care Physician:Fried, Herbie [place], MD  Referring Physician: Briscoe Deutscher, [address],  [place] [id number]   Chief complaint: Change in bowel habits  HPI:  64 year old female here for follow-up visit with complaints of change in bowel habits  In the past year stool is less formed and has increased faecal urgency with occassional liquid but mostly has semi formed soft stool She does not drink milk and eats cheese or ice cream only occasionally On average she has 2-3 semiformed soft bowel movements with increased fecal urgency.  She has changed her diet since colonoscopy, is eating more fruits and vegetables  She takes over-the-counter supplements and also magnesium.  Father had colon cancer in his 15s.  She also has a cousin who was diagnosed with stage IV colon cancer.  Colonoscopy October 25, 2015: 4 sessile subcentimeter polyps 2 of which were tubular adenomas. EGD October 25, 2015: LA grade A esophagitis  Outpatient Encounter Medications as of 04/04/2020  Medication Sig  . cholecalciferol (VITAMIN D) 1000 UNITS tablet Take 1,000 Units by mouth daily.  . Coenzyme Q10 (COQ-10) 100 MG CAPS Take 1 capsule by mouth daily.  Marland Kitchen levothyroxine (SYNTHROID, LEVOTHROID) 75 MCG tablet Take 75 mcg by mouth daily before breakfast.  . Magnesium 250 MG TABS Take 600 mg by mouth daily.   . Multiple Vitamins-Minerals (MULTIVITAMIN PO) Take by mouth.  . Omega-3 Fatty Acids (FISH OIL PO) Take by mouth.  . vitamin B-12 (CYANOCOBALAMIN) 100 MCG tablet Take 100 mcg by mouth daily.  . [DISCONTINUED] aspirin 81 MG tablet Take 81 mg by mouth daily.  . [DISCONTINUED] tolterodine (DETROL LA) 4 MG 24 hr capsule Take 4 mg by mouth daily as needed. Reported on 10/25/2015   No facility-administered encounter medications on file as of 04/04/2020.    Allergies as of 04/04/2020 - Review Complete 04/04/2020  Allergen Reaction  Noted  . Penicillins Rash 10/27/2013  . Sulfa antibiotics Rash 10/27/2013    Past Medical History:  Diagnosis Date  . Graves disease     Past Surgical History:  Procedure Laterality Date  . COLONOSCOPY  2007  . TUBAL LIGATION  1994    Family History  Problem Relation Age of Onset  . Colon cancer Father 95  . Esophageal cancer Neg Hx   . Rectal cancer Neg Hx   . Stomach cancer Neg Hx     Social History   Socioeconomic History  . Marital status: Married    Spouse name: Not on file  . Number of children: Not on file  . Years of education: Not on file  . Highest education level: Not on file  Occupational History  . Not on file  Tobacco Use  . Smoking status: Never Smoker  . Smokeless tobacco: Never Used  Substance and Sexual Activity  . Alcohol use: No    Alcohol/week: 0.0 standard drinks  . Drug use: No  . Sexual activity: Not on file  Other Topics Concern  . Not on file  Social History Narrative  . Not on file   Social Determinants of Health   Financial Resource Strain:   . Difficulty of Paying Living Expenses:   Food Insecurity:   . Worried About Charity fundraiser in the Last Year:   . Arboriculturist in the Last Year:   Transportation Needs:   .  Lack of Transportation (Medical):   Marland Kitchen Lack of Transportation (Non-Medical):   Physical Activity:   . Days of Exercise per Week:   . Minutes of Exercise per Session:   Stress:   . Feeling of Stress :   Social Connections:   . Frequency of Communication with Friends and Family:   . Frequency of Social Gatherings with Friends and Family:   . Attends Religious Services:   . Active Member of Clubs or Organizations:   . Attends Archivist Meetings:   Marland Kitchen Marital Status:   Intimate Partner Violence:   . Fear of Current or Ex-Partner:   . Emotionally Abused:   Marland Kitchen Physically Abused:   . Sexually Abused:       Review of systems: All other review of systems negative except as mentioned in the  HPI.   Physical Exam: Vitals:   04/04/20 1359  BP: 112/60  Pulse: 64   Body mass index is 21.4 kg/m. Gen:      No acute distress HEENT:  sclera anicteric Abd:       soft, non-tender; no palpable masses, no distension Ext:    No edema; adequate peripheral perfusion Skin:      Warm and dry; no rash Neuro: alert and oriented x 3 Psych: normal mood and affect  Data Reviewed:  Reviewed labs, radiology imaging, old records and pertinent past GI work up   Assessment and Plan/Recommendations:  64 year old female with history of Graves' disease, family history of colon cancer here with complaints of change in bowel habits with increased bowel frequency and fecal urgency  Diarrhea: We will need to exclude infectious etiology Check GI pathogen panel  Also check pancreatic elastase to exclude exocrine pancreatic insufficiency  Lactose intolerance possibly exacerbating her symptoms.  Advised patient to follow lactose-free diet for a week  Supplements/magnesium also potentially exacerbating symptoms, advised patient to stop the supplements.  Trial of Colestid 1 g daily for possible bile salt induced diarrhea  Return in 2 to 3 months or sooner if needed  This visit required >40 minutes of patient care (this includes precharting, chart review, review of results, face-to-face time used for counseling as well as treatment plan and follow-up. The patient was provided an opportunity to ask questions and all were answered. The patient agreed with the plan and demonstrated an understanding of the instructions.  Damaris Hippo , MD    CC: Briscoe Deutscher, MD

## 2020-04-06 LAB — GI PROFILE, STOOL, PCR

## 2020-04-12 LAB — PANCREATIC ELASTASE, FECAL: Pancreatic Elastase-1, Stool: 454 mcg/g

## 2020-08-05 ENCOUNTER — Other Ambulatory Visit: Payer: Self-pay | Admitting: Gastroenterology

## 2021-02-10 ENCOUNTER — Other Ambulatory Visit: Payer: Self-pay | Admitting: Gastroenterology

## 2021-02-25 ENCOUNTER — Ambulatory Visit (INDEPENDENT_AMBULATORY_CARE_PROVIDER_SITE_OTHER): Payer: Medicare Other | Admitting: Gastroenterology

## 2021-02-25 ENCOUNTER — Encounter: Payer: Self-pay | Admitting: Gastroenterology

## 2021-02-25 VITALS — BP 118/62 | HR 60 | Ht 62.0 in | Wt 113.4 lb

## 2021-02-25 DIAGNOSIS — Z860100 Personal history of colon polyps, unspecified: Secondary | ICD-10-CM

## 2021-02-25 DIAGNOSIS — E739 Lactose intolerance, unspecified: Secondary | ICD-10-CM | POA: Diagnosis not present

## 2021-02-25 DIAGNOSIS — Z800 Family history of malignant neoplasm of digestive organs: Secondary | ICD-10-CM

## 2021-02-25 DIAGNOSIS — K9089 Other intestinal malabsorption: Secondary | ICD-10-CM

## 2021-02-25 MED ORDER — COLESTIPOL HCL 1 G PO TABS: 1.0000 g | ORAL_TABLET | Freq: Every day | ORAL | 1 refills | Status: DC

## 2021-02-25 NOTE — Patient Instructions (Addendum)
It has been recommended to you by your physician that you have a(n) Colonoscopy completed. Per your request, we did not schedule the procedure(s) today. Please contact our office at [phone] should you decide to have the procedure completed. You will be scheduled for a pre-visit and procedure at that time.   We will send Colestid to your pharmacy try to taper the dose to use every other day   [employer]'s Current Therapy 2021 (pp. 213-216). [place], [place]: Elsevier.">  Gastroesophageal Reflux Disease, Adult Gastroesophageal reflux (GER) happens when acid from the stomach flows up into the tube that connects the mouth and the stomach (esophagus). Normally, food travels down the esophagus and stays in the stomach to be digested. However, when a person has GER, food and stomach acid sometimes move back up into the esophagus. If this becomes a more serious problem, the person may be diagnosed with a disease called gastroesophageal reflux disease (GERD). GERD occurs when the reflux:  Happens often.  Causes frequent or severe symptoms.  Causes problems such as damage to the esophagus. When stomach acid comes in contact with the esophagus, the acid may cause inflammation in the esophagus. Over time, GERD may create small holes (ulcers) in the lining of the esophagus. What are the causes? This condition is caused by a problem with the muscle between the esophagus and the stomach (lower esophageal sphincter, or LES). Normally, the LES muscle closes after food passes through the esophagus to the stomach. When the LES is weakened or abnormal, it does not close properly, and that allows food and stomach acid to go back up into the esophagus. The LES can be weakened by certain dietary substances, medicines, and medical conditions, including:  Tobacco use.  Pregnancy.  Having a hiatal hernia.  Alcohol use.  Certain foods and beverages, such as coffee, chocolate, onions, and peppermint. What increases  the risk? You are more likely to develop this condition if you:  Have an increased body weight.  Have a connective tissue disorder.  Take NSAIDs, such as ibuprofen. What are the signs or symptoms? Symptoms of this condition include:  Heartburn.  Difficult or painful swallowing and the feeling of having a lump in the throat.  A bitter taste in the mouth.  Bad breath and having a large amount of saliva.  Having an upset or bloated stomach and belching.  Chest pain. Different conditions can cause chest pain. Make sure you see your health care provider if you experience chest pain.  Shortness of breath or wheezing.  Ongoing (chronic) cough or a nighttime cough.  Wearing away of tooth enamel.  Weight loss. How is this diagnosed? This condition may be diagnosed based on a medical history and a physical exam. To determine if you have mild or severe GERD, your health care provider may also monitor how you respond to treatment. You may also have tests, including:  A test to examine your stomach and esophagus with a small camera (endoscopy).  A test that measures the acidity level in your esophagus.  A test that measures how much pressure is on your esophagus.  A barium swallow or modified barium swallow test to show the shape, size, and functioning of your esophagus. How is this treated? Treatment for this condition may vary depending on how severe your symptoms are. Your health care provider may recommend:  Changes to your diet.  Medicine.  Surgery. The goal of treatment is to help relieve your symptoms and to prevent complications. Follow these instructions at  home: Eating and drinking  Follow a diet as recommended by your health care provider. This may involve avoiding foods and drinks such as: ? Coffee and tea, with or without caffeine. ? Drinks that contain alcohol. ? Energy drinks and sports drinks. ? Carbonated drinks or sodas. ? Chocolate and  cocoa. ? Peppermint and mint flavorings. ? Garlic and onions. ? Horseradish. ? Spicy and acidic foods, including peppers, chili powder, curry powder, vinegar, hot sauces, and barbecue sauce. ? Citrus fruit juices and citrus fruits, such as oranges, lemons, and limes. ? Tomato-based foods, such as red sauce, chili, salsa, and pizza with red sauce. ? Fried and fatty foods, such as donuts, french fries, potato chips, and high-fat dressings. ? High-fat meats, such as hot dogs and fatty cuts of red and white meats, such as rib eye steak, sausage, ham, and bacon. ? High-fat dairy items, such as whole milk, butter, and cream cheese.  Eat small, frequent meals instead of large meals.  Avoid drinking large amounts of liquid with your meals.  Avoid eating meals during the 2-3 hours before bedtime.  Avoid lying down right after you eat.  Do not exercise right after you eat.   Lifestyle  Do not use any products that contain nicotine or tobacco. These products include cigarettes, chewing tobacco, and vaping devices, such as e-cigarettes. If you need help quitting, ask your health care provider.  Try to reduce your stress by using methods such as yoga or meditation. If you need help reducing stress, ask your health care provider.  If you are overweight, reduce your weight to an amount that is healthy for you. Ask your health care provider for guidance about a safe weight loss goal.   General instructions  Pay attention to any changes in your symptoms.  Take over-the-counter and prescription medicines only as told by your health care provider. Do not take aspirin, ibuprofen, or other NSAIDs unless your health care provider told you to take these medicines.  Wear loose-fitting clothing. Do not wear anything tight around your waist that causes pressure on your abdomen.  Raise (elevate) the head of your bed about 6 inches (15 cm). You can use a wedge to do this.  Avoid bending over if this makes  your symptoms worse.  Keep all follow-up visits. This is important. Contact a health care provider if:  You have: ? New symptoms. ? Unexplained weight loss. ? Difficulty swallowing or it hurts to swallow. ? Wheezing or a persistent cough. ? A hoarse voice.  Your symptoms do not improve with treatment. Get help right away if:  You have sudden pain in your arms, neck, jaw, teeth, or back.  You suddenly feel sweaty, dizzy, or light-headed.  You have chest pain or shortness of breath.  You vomit and the vomit is green, yellow, or black, or it looks like blood or coffee grounds.  You faint.  You have stool that is red, bloody, or black.  You cannot swallow, drink, or eat. These symptoms may represent a serious problem that is an emergency. Do not wait to see if the symptoms will go away. Get medical help right away. Call your local emergency services (911 in the [place] Do not drive yourself to the hospital. Summary  Gastroesophageal reflux happens when acid from the stomach flows up into the esophagus. GERD is a disease in which the reflux happens often, causes frequent or severe symptoms, or causes problems such as damage to the esophagus.  Treatment for this  condition may vary depending on how severe your symptoms are. Your health care provider may recommend diet and lifestyle changes, medicine, or surgery.  Contact a health care provider if you have new or worsening symptoms.  Take over-the-counter and prescription medicines only as told by your health care provider. Do not take aspirin, ibuprofen, or other NSAIDs unless your health care provider told you to do so.  Keep all follow-up visits as told by your health care provider. This is important. This information is not intended to replace advice given to you by your health care provider. Make sure you discuss any questions you have with your health care provider. Document Revised: 04/02/2020 Document Reviewed:  04/02/2020 Elsevier Patient Education  2021 [employer].   Due to recent changes in healthcare laws, you may see the results of your imaging and laboratory studies on MyChart before your provider has had a chance to review them.  We understand that in some cases there may be results that are confusing or concerning to you. Not all laboratory results come back in the same time frame and the provider may be waiting for multiple results in order to interpret others.  Please give [place] 48 hours in order for your provider to thoroughly review all the results before contacting the office for clarification of your results.   Thank you for choosing [facility]  Karleen Hampshire Nand[place]am,MD

## 2021-02-25 NOTE — Progress Notes (Signed)
Dawn Burns    [id number]    05/19/1956  Primary Care Physician:Fried, Herbie [place], MD  Referring Physician: Briscoe Deutscher, MD [address] [place],  [place] [address]   Chief complaint: Diarrhea, lactose intolerance  HPI:   65 year old very pleasant female here for follow-up visit for lactose intolerance and bile salt induced diarrhea She is using Lactaid milk and lactose-free cottage cheese but she does eat some cheese with no major symptoms.  She is also consuming increasing amount of fruit.  Overall she feels her bowel habits have improved. She is using colestipol 1 g daily at bedtime, has soft bowel movement daily with no liquid stool or urgency.  Denies any rectal bleeding, abdominal pain, vomiting or unintentional weight loss. Overall her symptoms have significantly improved.  Father had colon cancer in his 17s.  She also has a cousin who was diagnosed with stage IV colon cancer.  Colonoscopy October 25, 2015: 4 sessile subcentimeter polyps 2 of which were tubular adenomas. EGD October 25, 2015: LA grade A esophagitis   Outpatient Encounter Medications as of 02/25/2021  Medication Sig  . cholecalciferol (VITAMIN D) 1000 UNITS tablet Take 1,000 Units by mouth daily.  . Coenzyme Q10 (COQ-10) 100 MG CAPS Take 1 capsule by mouth daily.  Marland Kitchen levothyroxine (SYNTHROID, LEVOTHROID) 75 MCG tablet Take 75 mcg by mouth daily before breakfast.  . Magnesium 250 MG TABS Take 600 mg by mouth daily.   . Multiple Vitamins-Minerals (MULTIVITAMIN PO) Take by mouth.  . Omega-3 Fatty Acids (FISH OIL PO) Take by mouth.  . vitamin B-12 (CYANOCOBALAMIN) 100 MCG tablet Take 100 mcg by mouth daily.  . [DISCONTINUED] colestipol (COLESTID) 1 g tablet TAKE 1 TABLET (1 G TOTAL) BY MOUTH AT BEDTIME.  . colestipol (COLESTID) 1 g tablet Take 1 tablet (1 g total) by mouth at bedtime.   No facility-administered encounter medications on file as of 02/25/2021.    Allergies as of  02/25/2021 - Review Complete 02/25/2021  Allergen Reaction Noted  . Penicillins Rash 10/27/2013  . Sulfa antibiotics Rash 10/27/2013    Past Medical History:  Diagnosis Date  . Graves disease     Past Surgical History:  Procedure Laterality Date  . COLONOSCOPY  2007  . TUBAL LIGATION  1994    Family History  Problem Relation Age of Onset  . Colon cancer Father 57  . Esophageal cancer Neg Hx   . Rectal cancer Neg Hx   . Stomach cancer Neg Hx     Social History   Socioeconomic History  . Marital status: Married    Spouse name: Not on file  . Number of children: Not on file  . Years of education: Not on file  . Highest education level: Not on file  Occupational History  . Not on file  Tobacco Use  . Smoking status: Never Smoker  . Smokeless tobacco: Never Used  Substance and Sexual Activity  . Alcohol use: No    Alcohol/week: 0.0 standard drinks  . Drug use: No  . Sexual activity: Not on file  Other Topics Concern  . Not on file  Social History Narrative  . Not on file   Social Determinants of Health   Financial Resource Strain: Not on file  Food Insecurity: Not on file  Transportation Needs: Not on file  Physical Activity: Not on file  Stress: Not on file  Social Connections: Not on file  Intimate Partner Violence:  Not on file      Review of systems: All other review of systems negative except as mentioned in the HPI.   Physical Exam: Vitals:   02/25/21 0908  BP: 118/62  Pulse: 60   Body mass index is 20.74 kg/m. Gen:      No acute distress Neuro: alert and oriented x 3 Psych: normal mood and affect  Data Reviewed:  Reviewed labs, radiology imaging, old records and pertinent past GI work up   Assessment and Plan/Recommendations:  65 year old very pleasant female with family history of colon cancer, personal history of colon polyps with bile salt induced diarrhea and lactose intolerance  Continue with lactose-free diet Okay to use  Lactaid pills as needed if consuming excess amount of lactose  Bile salt diarrhea: Use colestipol 1 g daily at bedtime, advised patient to slowly wean off the medication but she may need to go back on it if she develops recurrent diarrhea  Due for surveillance colonoscopy, will schedule it The risks and benefits as well as alternatives of endoscopic procedure(s) have been discussed and reviewed. All questions answered. The patient agrees to proceed.   The patient was provided an opportunity to ask questions and all were answered. The patient agreed with the plan and demonstrated an understanding of the instructions.  Damaris Hippo , MD    CC: Briscoe Deutscher, MD

## 2021-02-26 ENCOUNTER — Encounter: Payer: Self-pay | Admitting: Gastroenterology

## 2021-05-20 ENCOUNTER — Ambulatory Visit (AMBULATORY_SURGERY_CENTER): Payer: Self-pay

## 2021-05-20 ENCOUNTER — Other Ambulatory Visit: Payer: Self-pay

## 2021-05-20 VITALS — Ht 62.0 in | Wt 115.0 lb

## 2021-05-20 DIAGNOSIS — Z800 Family history of malignant neoplasm of digestive organs: Secondary | ICD-10-CM

## 2021-05-20 DIAGNOSIS — Z86010 Personal history of colonic polyps: Secondary | ICD-10-CM

## 2021-05-20 MED ORDER — SUTAB 1479-225-188 MG PO TABS: 12.0000 | ORAL_TABLET | ORAL | 0 refills | Status: DC

## 2021-05-20 NOTE — Progress Notes (Signed)
No allergies to soy or egg Pt is not on blood thinners or diet pills Denies issues with sedation/intubation Denies atrial flutter/fib Denies constipation   Emmi instructions given to pt  Pt is aware of Covid safety and care partner requirements.  

## 2021-06-03 ENCOUNTER — Encounter: Payer: Self-pay | Admitting: Gastroenterology

## 2021-06-03 ENCOUNTER — Other Ambulatory Visit: Payer: Self-pay

## 2021-06-03 ENCOUNTER — Other Ambulatory Visit: Payer: Self-pay | Admitting: Gastroenterology

## 2021-06-03 ENCOUNTER — Ambulatory Visit (AMBULATORY_SURGERY_CENTER): Payer: Medicare Other | Admitting: Gastroenterology

## 2021-06-03 VITALS — BP 105/66 | HR 68 | Temp 95.9°F | Resp 13

## 2021-06-03 DIAGNOSIS — D123 Benign neoplasm of transverse colon: Secondary | ICD-10-CM

## 2021-06-03 DIAGNOSIS — Z86010 Personal history of colonic polyps: Secondary | ICD-10-CM | POA: Diagnosis present

## 2021-06-03 MED ORDER — SODIUM CHLORIDE 0.9 % IV SOLN: 500.0000 mL | Freq: Once | INTRAVENOUS | Status: DC

## 2021-06-03 NOTE — Progress Notes (Signed)
PT taken to PACU. Monitors in place. VSS. Report given to RN. 

## 2021-06-03 NOTE — Progress Notes (Signed)
[facility] History and Physical   Primary Care Physician:  Briscoe Deutscher, MD   Reason for Procedure:  History of adenomatous colon polyps, family history of colon cancer  Plan:    Surveillance colonoscopy with possible interventions as needed     HPI: Dawn Burns is a very pleasant 65 y.o. female here for  colonoscopy. Denies any nausea, vomiting, abdominal pain, melena or bright red blood per rectum  The risks and benefits as well as alternatives of endoscopic procedure(s) have been discussed and reviewed. All questions answered. The patient agrees to proceed.    Past Medical History:  Diagnosis Date   Arthritis    Graves disease     Past Surgical History:  Procedure Laterality Date   COLONOSCOPY  10/06/2005   SHOULDER SURGERY     TUBAL LIGATION  10/06/1992    Prior to Admission medications   Medication Sig Start Date End Date Taking? Authorizing Provider  cholecalciferol (VITAMIN D) 1000 UNITS tablet Take 1,000 Units by mouth daily.   Yes [provider]  Coenzyme Q10 (COQ-10) 100 MG CAPS Take 1 capsule by mouth daily.   Yes [provider]  levothyroxine (SYNTHROID, LEVOTHROID) 75 MCG tablet Take 75 mcg by mouth daily before breakfast.   Yes [provider]  tretinoin (RETIN-A) 0.025 % cream Apply topically at bedtime. 02/21/21  Yes [provider]  colestipol (COLESTID) 1 g tablet Take 1 tablet (1 g total) by mouth at bedtime. Patient not taking: Reported on 05/20/2021 02/25/21   Mauri Pole, MD  Magnesium 250 MG TABS Take 600 mg by mouth daily.  Patient not taking: Reported on 05/20/2021    [provider]  Multiple Vitamins-Minerals (MULTIVITAMIN PO) Take by mouth. Patient not taking: Reported on 05/20/2021    [provider]  NON FORMULARY Focus Factor    [provider]  Omega-3 Fatty Acids (FISH OIL PO) Take by mouth. Patient not taking: Reported on 06/03/2021    [provider]  vitamin B-12 (CYANOCOBALAMIN) 100 MCG tablet Take 100 mcg by mouth daily. Patient not taking: No sig reported    [provider]    Current Outpatient Medications  Medication Sig Dispense Refill   cholecalciferol (VITAMIN D) 1000 UNITS tablet Take 1,000 Units by mouth daily.     Coenzyme Q10 (COQ-10) 100 MG CAPS Take 1 capsule by mouth daily.     levothyroxine (SYNTHROID, LEVOTHROID) 75 MCG tablet Take 75 mcg by mouth daily before breakfast.     tretinoin (RETIN-A) 0.025 % cream Apply topically at bedtime.     colestipol (COLESTID) 1 g tablet Take 1 tablet (1 g total) by mouth at bedtime. (Patient not taking: Reported on 05/20/2021) 90 tablet 1   Magnesium 250 MG TABS Take 600 mg by mouth daily.  (Patient not taking: Reported on 05/20/2021)     Multiple Vitamins-Minerals (MULTIVITAMIN PO) Take by mouth. (Patient not taking: Reported on 05/20/2021)     NON FORMULARY Focus Factor     Omega-3 Fatty Acids (FISH OIL PO) Take by mouth. (Patient not taking: Reported on 06/03/2021)     vitamin B-12 (CYANOCOBALAMIN) 100 MCG tablet Take 100 mcg by mouth daily. (Patient not taking: No sig reported)     Current Facility-Administered Medications  Medication Dose Route Frequency Provider Last Rate Last Admin   0.9 %  sodium chloride infusion  500 mL Intravenous Once Mauri Pole, MD        Allergies as of 06/03/2021 -  Review Complete 05/20/2021  Allergen Reaction Noted   Penicillins Rash 10/27/2013   Sulfa antibiotics Rash 10/27/2013    Family History  Problem Relation Age of Onset   Colon cancer Father 85   Esophageal cancer Neg Hx    Rectal cancer Neg Hx    Stomach cancer Neg Hx    Colon polyps Neg Hx     Social History   Socioeconomic History   Marital status: Married    Spouse name: Not on file   Number of children: Not on file   Years of education: Not on file   Highest education level: Not on file  Occupational History   Not on file  Tobacco Use    Smoking status: Never   Smokeless tobacco: Never  Vaping Use   Vaping Use: Never used  Substance and Sexual Activity   Alcohol use: No    Alcohol/week: 0.0 standard drinks   Drug use: No   Sexual activity: Not on file  Other Topics Concern   Not on file  Social History Narrative   Not on file   Social Determinants of Health   Financial Resource Strain: Not on file  Food Insecurity: Not on file  Transportation Needs: Not on file  Physical Activity: Not on file  Stress: Not on file  Social Connections: Not on file  Intimate Partner Violence: Not on file    Review of Systems:  All other review of systems negative except as mentioned in the HPI.  Physical Exam: Vital signs in last 24 hours: BP 107/60   Pulse (!) 51   Temp (!) 95.9 F (35.5 C)   SpO2 97%    General:   Alert, NAD Lungs:  Clear .   Heart:  Regular rate and rhythm Abdomen:  Soft, nontender and nondistended. Neuro/Psych:  Alert and cooperative. Normal mood and affect. A and O x 3  Reviewed labs, radiology imaging, old records and pertinent past GI work up  Patient is appropriate for planned procedure(s) and anesthesia in an ambulatory setting   K. Denzil Magnuson , MD [phone]

## 2021-06-03 NOTE — Op Note (Signed)
[facility] Patient Name: Dawn Burns Procedure Date: 06/03/2021 11:02 AM MRN: [id number] Endoscopist: Mauri Pole , MD Age: 65 Referring MD:  Date of Birth: 25-Mar-1956 Gender: Female Account #: [id number] Procedure:                Colonoscopy Indications:              Screening patient at increased risk: Family history                            of 1st-degree relative with colorectal cancer at                            age 27 years (or older), High risk colon cancer                            surveillance: Personal history of colonic polyps,                            High risk colon cancer surveillance: Personal                            history of adenoma less than 10 mm in size Medicines:                Monitored Anesthesia Care Procedure:                Pre-Anesthesia Assessment:                           - Prior to the procedure, a History and Physical                            was performed, and patient medications and                            allergies were reviewed. The patient's tolerance of                            previous anesthesia was also reviewed. The risks                            and benefits of the procedure and the sedation                            options and risks were discussed with the patient.                            All questions were answered, and informed consent                            was obtained. Prior Anticoagulants: The patient has                            taken no previous anticoagulant or antiplatelet  agents. ASA Grade Assessment: II - A patient with                            mild systemic disease. After reviewing the risks                            and benefits, the patient was deemed in                            satisfactory condition to undergo the procedure.                           After obtaining informed consent, the colonoscope                            was passed  under direct vision. Throughout the                            procedure, the patient's blood pressure, pulse, and                            oxygen saturations were monitored continuously. The                            Olympus [id number]) Colonoscope was                            introduced through the anus and advanced to the the                            cecum, identified by appendiceal orifice and                            ileocecal valve. The colonoscopy was performed                            without difficulty. The patient tolerated the                            procedure well. The quality of the bowel                            preparation was excellent. The ileocecal valve,                            appendiceal orifice, and rectum were photographed. Scope In: 11:10:47 AM Scope Out: 11:41:58 AM Scope Withdrawal Time: 0 hours 18 minutes 49 seconds  Total Procedure Duration: 0 hours 31 minutes 11 seconds  Findings:                 The perianal and digital rectal examinations were                            normal.  Three small patchy angiodysplastic lesions with                            typical arborization were found in the transverse                            colon and in the ascending colon.                           A 1 mm polyp was found in the transverse colon. The                            polyp was sessile and was located in close                            proximity to small AVM. The polyp was removed with                            a cold biopsy forceps. Resection and retrieval were                            complete. Noted brisk oozing s/p polypectomy. To                            prevent bleeding after the polypectomy, two                            hemostatic clips were successfully placed (MR                            conditional). There was no bleeding at the end of                            the procedure.                            A 1 mm polyp was found in the transverse colon. The                            polyp was sessile. The polyp was removed with a                            cold biopsy forceps. Resection and retrieval were                            complete.                           Non-bleeding internal hemorrhoids were found during                            retroflexion. The hemorrhoids were small. Complications:            No immediate complications. Estimated Blood Loss:  Estimated blood loss was minimal. Impression:               - Three colonic angiodysplastic lesions.                           - One 1 mm polyp in the transverse colon, removed                            with a cold biopsy forceps. Resected and retrieved.                            Clips (MR conditional) were placed.                           - One 1 mm polyp in the transverse colon, removed                            with a cold biopsy forceps. Resected and retrieved.                           - Non-bleeding internal hemorrhoids. Recommendation:           - Patient has a contact number available for                            emergencies. The signs and symptoms of potential                            delayed complications were discussed with the                            patient. Return to normal activities tomorrow.                            Written discharge instructions were provided to the                            patient.                           - Resume previous diet.                           - Continue present medications.                           - No aspirin, ibuprofen, naproxen, or other                            non-steroidal anti-inflammatory drugs for 2 weeks.                           - Await pathology results.                           - Repeat colonoscopy in 5 years for surveillance  based on pathology results. Mauri Pole, MD 06/03/2021 11:47:16 AM This  report has been signed electronically.

## 2021-06-03 NOTE — Progress Notes (Signed)
Vs by CW  I have reviewed the patient's medical history in detail and updated the computerized patient record.

## 2021-06-03 NOTE — Progress Notes (Signed)
Called to room to assist during endoscopic procedure.  Patient ID and intended procedure confirmed with present staff. Received instructions for my participation in the procedure from the performing physician.  

## 2021-06-03 NOTE — Patient Instructions (Signed)
Handouts given for polyps and hemorrhoids.  Clip card given and explained to pt.  Await pathology results.  No aspirin, ibuprofen, naproxen, or other NSAIDS for 2 weeks.  You may take Tylenol if needed.    YOU HAD AN ENDOSCOPIC PROCEDURE TODAY AT [facility]:   Refer to the procedure report that was given to you for any specific questions about what was found during the examination.  If the procedure report does not answer your questions, please call your gastroenterologist to clarify.  If you requested that your care partner not be given the details of your procedure findings, then the procedure report has been included in a sealed envelope for you to review at your convenience later.  YOU SHOULD EXPECT: Some feelings of bloating in the abdomen. Passage of more gas than usual.  Walking can help get rid of the air that was put into your GI tract during the procedure and reduce the bloating. If you had a lower endoscopy (such as a colonoscopy or flexible sigmoidoscopy) you may notice spotting of blood in your stool or on the toilet paper. If you underwent a bowel prep for your procedure, you may not have a normal bowel movement for a few days.  Please Note:  You might notice some irritation and congestion in your nose or some drainage.  This is from the oxygen used during your procedure.  There is no need for concern and it should clear up in a day or so.  SYMPTOMS TO REPORT IMMEDIATELY:  Following lower endoscopy (colonoscopy or flexible sigmoidoscopy):  Excessive amounts of blood in the stool  Significant tenderness or worsening of abdominal pains  Swelling of the abdomen that is new, acute  Fever of 100F or higher  For urgent or emergent issues, a gastroenterologist can be reached at any hour by calling [phone]. Do not use MyChart messaging for urgent concerns.    DIET:  We do recommend a small meal at first, but then you may proceed to your regular diet.  Drink  plenty of fluids but you should avoid alcoholic beverages for 24 hours.  ACTIVITY:  You should plan to take it easy for the rest of today and you should NOT DRIVE or use heavy machinery until tomorrow (because of the sedation medicines used during the test).    FOLLOW UP: Our staff will call the number listed on your records 48-72 hours following your procedure to check on you and address any questions or concerns that you may have regarding the information given to you following your procedure. If we do not reach you, we will leave a message.  We will attempt to reach you two times.  During this call, we will ask if you have developed any symptoms of COVID 19. If you develop any symptoms (ie: fever, flu-like symptoms, shortness of breath, cough etc.) before then, please call [phone].  If you test positive for Covid 19 in the 2 weeks post procedure, please call and report this information to [place].    If any biopsies were taken you will be contacted by phone or by letter within the next 1-3 weeks.  Please call us at [phone] if you have not heard about the biopsies in 3 weeks.    SIGNATURES/CONFIDENTIALITY: You and/or your care partner have signed paperwork which will be entered into your electronic medical record.  These signatures attest to the fact that that the information above on your After Visit Summary has been reviewed  and is understood.  Full responsibility of the confidentiality of this discharge information lies with you and/or your care-partner.

## 2021-06-05 ENCOUNTER — Telehealth: Payer: Self-pay | Admitting: *Deleted

## 2021-06-05 NOTE — Telephone Encounter (Signed)
  Follow up Call-  Call back number 06/03/2021  Post procedure Call Back phone  # [phone]  Permission to leave phone message Yes  Some recent data might be hidden     Patient questions:  Do you have a fever, pain , or abdominal swelling? No. Pain Score  0 *  Have you tolerated food without any problems? Yes.    Have you been able to return to your normal activities? Yes.    Do you have any questions about your discharge instructions: Diet   No. Medications  No. Follow up visit  No.  Do you have questions or concerns about your Care? No.  Actions: * If pain score is 4 or above: No action needed, pain <4.  Have you developed a fever since your procedure? no  2.   Have you had an respiratory symptoms (SOB or cough) since your procedure? no  3.   Have you tested positive for COVID 19 since your procedure no  4.   Have you had any family members/close contacts diagnosed with the COVID 19 since your procedure?  no   If yes to any of these questions please route to Joylene John, RN and Joella Prince, RN

## 2021-06-18 ENCOUNTER — Encounter: Payer: Self-pay | Admitting: Gastroenterology

## 2022-04-11 ENCOUNTER — Other Ambulatory Visit (HOSPITAL_BASED_OUTPATIENT_CLINIC_OR_DEPARTMENT_OTHER): Payer: Self-pay | Admitting: Family Medicine

## 2022-04-11 DIAGNOSIS — Z8249 Family history of ischemic heart disease and other diseases of the circulatory system: Secondary | ICD-10-CM

## 2022-04-25 ENCOUNTER — Ambulatory Visit (HOSPITAL_BASED_OUTPATIENT_CLINIC_OR_DEPARTMENT_OTHER)
Admission: RE | Admit: 2022-04-25 | Discharge: 2022-04-25 | Disposition: A | Discharge status: Home / Self Care | Payer: Medicare Other | Source: Ambulatory Visit | Attending: Family Medicine | Admitting: Family Medicine

## 2022-04-25 DIAGNOSIS — Z8249 Family history of ischemic heart disease and other diseases of the circulatory system: Secondary | ICD-10-CM | POA: Insufficient documentation

## 2022-11-10 ENCOUNTER — Other Ambulatory Visit: Payer: Self-pay

## 2022-11-10 DIAGNOSIS — R911 Solitary pulmonary nodule: Secondary | ICD-10-CM

## 2022-11-22 ENCOUNTER — Ambulatory Visit (HOSPITAL_BASED_OUTPATIENT_CLINIC_OR_DEPARTMENT_OTHER)
Admission: RE | Admit: 2022-11-22 | Discharge: 2022-11-22 | Disposition: A | Discharge status: Home / Self Care | Payer: Medicare Other | Source: Ambulatory Visit | Attending: Family Medicine | Admitting: Family Medicine

## 2022-11-22 DIAGNOSIS — R911 Solitary pulmonary nodule: Secondary | ICD-10-CM | POA: Diagnosis present

## 2022-12-24 ENCOUNTER — Ambulatory Visit (INDEPENDENT_AMBULATORY_CARE_PROVIDER_SITE_OTHER): Payer: Medicare Other | Admitting: Pulmonary Disease

## 2022-12-24 ENCOUNTER — Encounter: Payer: Self-pay | Admitting: Pulmonary Disease

## 2022-12-24 VITALS — BP 118/72 | HR 63 | Temp 97.9°F | Ht 62.5 in | Wt 116.0 lb

## 2022-12-24 DIAGNOSIS — J479 Bronchiectasis, uncomplicated: Secondary | ICD-10-CM | POA: Diagnosis not present

## 2022-12-24 LAB — CBC WITH DIFFERENTIAL/PLATELET
Basophils Absolute: 0.1 10*3/uL (ref 0.0–0.1)
Basophils Relative: 0.9 % (ref 0.0–3.0)
Eosinophils Absolute: 0.2 10*3/uL (ref 0.0–0.7)
Eosinophils Relative: 2 % (ref 0.0–5.0)
HCT: 39.1 % (ref 36.0–46.0)
Hemoglobin: 13 g/dL (ref 12.0–15.0)
Lymphocytes Relative: 28.7 % (ref 12.0–46.0)
Lymphs Abs: 2.3 10*3/uL (ref 0.7–4.0)
MCHC: 33.1 g/dL (ref 30.0–36.0)
MCV: 88 fl (ref 78.0–100.0)
Monocytes Absolute: 0.6 10*3/uL (ref 0.1–1.0)
Monocytes Relative: 7.1 % (ref 3.0–12.0)
Neutro Abs: 5 10*3/uL (ref 1.4–7.7)
Neutrophils Relative %: 61.3 % (ref 43.0–77.0)
Platelets: 301 10*3/uL (ref 150.0–400.0)
RBC: 4.45 Mil/uL (ref 3.87–5.11)
RDW: 14.2 % (ref 11.5–15.5)
WBC: 8.2 10*3/uL (ref 4.0–10.5)

## 2022-12-24 NOTE — Progress Notes (Signed)
Dawn Burns    [id number]    1956/04/20  Primary Care Physician:Masneri, Adele Barthel, DO  Referring Physician: Lyman Bishop, DO Dawn Burns [id number]  Chief complaint: Consult for abnormal CT  HPI: 67 y.o. who  has a past medical history of Arthritis and Graves disease.   Referred for evaluation of abnormal CT.  She had a CT scan of the chest as a follow-up for lung nodules which showed mild tree-in-bud nodularity, bronchiectasis and mild scarring.  She has occasional cough with mucus in the lung.  No dyspnea History notable for arthritis for which she saw Dr. Amil Burns around 2020 and was told that she does not have any significant rheumatologic problem.  Pets: No pets.  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  No feather pillows or comforters Smoking history: Never smoker.  Exposed to secondhand smoke as a child Travel history: From [place].  No significant travel history Relevant family history: No family history of lung disease   Outpatient Encounter Medications as of 12/24/2022  Medication Sig   cholecalciferol (VITAMIN D) 1000 UNITS tablet Take 1,400 Units by mouth daily.   levothyroxine (SYNTHROID, LEVOTHROID) 75 MCG tablet Take 75 mcg by mouth daily before breakfast.   Magnesium 250 MG TABS Take 600 mg by mouth daily.   vitamin B-12 (CYANOCOBALAMIN) 100 MCG tablet Take 100 mcg by mouth daily.   Zinc 50 MG TABS Take by mouth.   NON FORMULARY Focus Factor (Patient not taking: Reported on 12/24/2022)   Omega-3 Fatty Acids (FISH OIL PO) Take by mouth. (Patient not taking: Reported on 06/03/2021)   [DISCONTINUED] Coenzyme Q10 (COQ-10) 100 MG CAPS Take 1 capsule by mouth daily. (Patient not taking: Reported on 12/24/2022)   [DISCONTINUED] colestipol (COLESTID) 1 g tablet Take 1 tablet (1 g total) by mouth at bedtime. (Patient not taking: Reported  on 05/20/2021)   [DISCONTINUED] Multiple Vitamins-Minerals (MULTIVITAMIN PO) Take by mouth. (Patient not taking: Reported on 05/20/2021)   [DISCONTINUED] tretinoin (RETIN-A) 0.025 % cream Apply topically at bedtime. (Patient not taking: Reported on 12/24/2022)   No facility-administered encounter medications on file as of 12/24/2022.    Allergies as of 12/24/2022 - Review Complete 12/24/2022  Allergen Reaction Noted   Bacitra-neomycin-polymyxin-hc Itching 12/24/2022   Penicillins Rash 10/27/2013   Sulfa antibiotics Rash 10/27/2013    Past Medical History:  Diagnosis Date   Arthritis    Graves disease     Past Surgical History:  Procedure Laterality Date   COLONOSCOPY  10/06/2005   SHOULDER SURGERY     TUBAL LIGATION  10/06/1992    Family History  Problem Relation Age of Onset   Colon cancer Father 89   Esophageal cancer Neg Hx    Rectal cancer Neg Hx    Stomach cancer Neg Hx    Colon polyps Neg Hx     Social History   Socioeconomic History   Marital status: Married    Spouse name: Not on file   Number of children: Not on file   Years of education: Not on file   Highest education level: Not on file  Occupational History   Not on file  Tobacco Use   Smoking status: Never   Smokeless tobacco: Never  Vaping Use   Vaping Use: Never used  Substance and Sexual Activity   Alcohol use: No  Alcohol/week: 0.0 standard drinks of alcohol   Drug use: No   Sexual activity: Not on file  Other Topics Concern   Not on file  Social History Narrative   Not on file   Social Determinants of Health   Financial Resource Strain: Not on file  Food Insecurity: Not on file  Transportation Needs: Not on file  Physical Activity: Not on file  Stress: Not on file  Social Connections: Not on file  Intimate Partner Violence: Not on file    Review of systems: Review of Systems  Constitutional: Negative for fever and chills.  HENT: Negative.   Eyes: Negative for blurred vision.   Respiratory: as per HPI  Cardiovascular: Negative for chest pain and palpitations.  Gastrointestinal: Negative for vomiting, diarrhea, blood per rectum. Genitourinary: Negative for dysuria, urgency, frequency and hematuria.  Musculoskeletal: Negative for myalgias, back pain and joint pain.  Skin: Negative for itching and rash.  Neurological: Negative for dizziness, tremors, focal weakness, seizures and loss of consciousness.  Endo/Heme/Allergies: Negative for environmental allergies.  Psychiatric/Behavioral: Negative for depression, suicidal ideas and hallucinations.  All other systems reviewed and are negative.  Physical Exam: Blood pressure 118/72, pulse 63, temperature 97.9 F (36.6 C), temperature source Oral, height 5' 2.5" (1.588 m), weight 116 lb (52.6 kg), SpO2 96 %. Gen:      No acute distress HEENT:  EOMI, sclera anicteric Neck:     No masses; no thyromegaly Lungs:    Clear to auscultation bilaterally; normal respiratory effort CV:         Regular rate and rhythm; no murmurs Abd:      + bowel sounds; soft, non-tender; no palpable masses, no distension Ext:    No edema; adequate peripheral perfusion Skin:      Warm and dry; no rash Neuro: alert and oriented x 3 Psych: normal mood and affect  Data Reviewed: Imaging: Cardiac CT 04/27/2022-right lower lobe pulmonary nodule, mild bronchiectasis CT chest 12/03/2022-evolution of right lower lobe opacity into scar, bronchiectasis in the right lower lobe, right middle lobe with tree-in-bud I had reviewed the images personally.  PFTs:  Labs:  Assessment:  Abnormal CT Initially noted to have pulmonary nodule on cardiac CT and on follow-up appears to be more scarring with associated bronchiectasis, tree-in-bud opacities  Findings are suggestive of chronic MAI infection though she does not have significant symptoms.  Given mild findings very likely observe Will also need to rule out other etiology such as rheumatoid arthritis  given arthritis symptoms  Plan/Recommendations: CBC, IgE, immunoglobulins, CF, alpha-1 antitrypsin, rheumatoid factor, CCP Sputum for AFB PFTs  Dawn Garfinkel MD [facility] 12/24/2022, 1:21 PM  CC: Dawn Bishop, DO

## 2022-12-24 NOTE — Patient Instructions (Addendum)
Will get some labs today including CBC with differential, IgE, quantitative immunoglobulins, cystic fibrosis panel, alpha-1 antitrypsin levels and phenotype, rheumatoid factor, CCP  Check sputum for regular AFB and regular cultures  Will schedule PFTs in 3 months and follow-up in clinic

## 2022-12-25 LAB — RHEUMATOID FACTOR: Rheumatoid fact SerPl-aCnc: <14 IU/mL (ref <14)

## 2022-12-25 LAB — IGG, IGA, IGM
IgG (Immunoglobin G), Serum: 914 mg/dL (ref 600–1540)
IgM, Serum: 55 mg/dL (ref 50–300)
Immunoglobulin A: 236 mg/dL (ref 70–320)

## 2022-12-25 LAB — IGE: IgE (Immunoglobulin E), Serum: 18 kU/L (ref <=114)

## 2022-12-27 LAB — CYCLIC CITRUL PEPTIDE ANTIBODY, IGG: Cyclic Citrullin Peptide Ab: <16 UNITS

## 2022-12-29 ENCOUNTER — Other Ambulatory Visit: Payer: Medicare Other

## 2022-12-29 DIAGNOSIS — J479 Bronchiectasis, uncomplicated: Secondary | ICD-10-CM

## 2022-12-30 LAB — RESPIRATORY CULTURE OR RESPIRATORY AND SPUTUM CULTURE: MICRO NUMBER:: 14736325

## 2023-01-03 LAB — ALPHA-1 ANTITRYPSIN PHENOTYPE: A-1 Antitrypsin, Ser: 135 mg/dL (ref 83–199)

## 2023-01-06 ENCOUNTER — Telehealth: Payer: Self-pay | Admitting: Pulmonary Disease

## 2023-01-06 NOTE — Telephone Encounter (Signed)
Pt called the office stating that she was supposed to do  sputum specimen and stated that she did one which came back inconclusive. Pt said she did another one and said results are showing in mychart. Stated to her that we would call her to go over results after Dr. Vaughan Browner had reviewed them and she verbalized understanding. Nothing further needed.

## 2023-01-30 LAB — CYSTIC FIBROSIS, 97 VARIANTS

## 2023-02-19 ENCOUNTER — Ambulatory Visit: Payer: Medicare Other | Admitting: Interventional Cardiology

## 2023-03-19 ENCOUNTER — Telehealth: Payer: Self-pay | Admitting: Pulmonary Disease

## 2023-03-19 NOTE — Telephone Encounter (Signed)
I called and spoke with the pt and notified of recommendations from Tammy  She verbalized understanding  Nothing further needed

## 2023-03-19 NOTE — Telephone Encounter (Signed)
PT calling for results per AVS:   Will get some labs today including CBC with differential, IgE, quantitative immunoglobulins, cystic fibrosis panel, alpha-1 antitrypsin levels and phenotype, rheumatoid factor, CCP   Check sputum for regular AFB and regular cultures  Please call @ [phone]

## 2023-03-19 NOTE — Telephone Encounter (Signed)
Her labs were normal from March. She will need to have these symptoms evaluated as recommended by her PCP .  She was to set up a office visit with pulmonary function testing to discuss everything with Dr. Isaiah Serge Please contact office for sooner follow up if symptoms do not improve or worsen or seek emergency care

## 2023-03-19 NOTE — Telephone Encounter (Signed)
Called and spoke w/ pt let her know that per PM the labs from 3/20 state "Labs are normal".   Pt reports that she has started on Lexapro last Sunday (6/8) and she states that she has been fatigued increasingly and having a feeling of increased heaviness in her chest and getting a good inhale into her lungs. She reports also that she hasn't been able to exert herself. She is wondering if these are side effects. [facility] PCP Dr.Thacker recommended a ED visit to address concerns.   TP please advise?

## 2023-04-13 ENCOUNTER — Ambulatory Visit: Payer: Medicare Other | Attending: Cardiology | Admitting: Cardiology

## 2023-04-13 ENCOUNTER — Encounter: Payer: Self-pay | Admitting: Cardiology

## 2023-04-13 ENCOUNTER — Ambulatory Visit: Payer: Medicare Other | Admitting: Cardiology

## 2023-04-13 ENCOUNTER — Telehealth (HOSPITAL_COMMUNITY): Payer: Self-pay | Admitting: *Deleted

## 2023-04-13 VITALS — BP 112/70 | HR 57 | Ht 62.5 in | Wt 112.0 lb

## 2023-04-13 DIAGNOSIS — Z01812 Encounter for preprocedural laboratory examination: Secondary | ICD-10-CM | POA: Insufficient documentation

## 2023-04-13 DIAGNOSIS — I2510 Atherosclerotic heart disease of native coronary artery without angina pectoris: Secondary | ICD-10-CM | POA: Insufficient documentation

## 2023-04-13 DIAGNOSIS — R072 Precordial pain: Secondary | ICD-10-CM | POA: Diagnosis not present

## 2023-04-13 DIAGNOSIS — R0609 Other forms of dyspnea: Secondary | ICD-10-CM | POA: Diagnosis not present

## 2023-04-13 LAB — BASIC METABOLIC PANEL
BUN/Creatinine Ratio: 18 (ref 12–28)
BUN: 15 mg/dL (ref 8–27)
CO2: 26 mmol/L (ref 20–29)
Calcium: 9.5 mg/dL (ref 8.7–10.3)
Chloride: 101 mmol/L (ref 96–106)
Creatinine, Ser: 0.82 mg/dL (ref 0.57–1.00)
Glucose: 88 mg/dL (ref 70–99)
Potassium: 4.4 mmol/L (ref 3.5–5.2)
Sodium: 140 mmol/L (ref 134–144)
eGFR: 78 mL/min/{1.73_m2} (ref >59)

## 2023-04-13 NOTE — Progress Notes (Signed)
Cardiology Office Note:    Date:  04/13/2023   ID:  Dawn Burns, DOB 04-18-1956, MRN [id number]  PCP:  Koren Shiver, DO   [facility] Providers Cardiologist:  Donato Schultz, MD     Referring MD: Koren Shiver, DO    History of Present Illness:    Dawn Burns is a 67 y.o. female here for the evaluation of coronary artery disease with coronary calcium score of 251, 88th percentile on 04/2022.  Incidentally saw 8 mm nodule, followed by Dr. Redgie Grayer.  We take care of her husband, Dawn Burns.  She had episodes of nonexertional pressure-like sensation in her mid chest. Uncomfortable, all night. Woke up with it. Since May 2024  Most of the time she can exercise regularly without any chest discomfort but more out of breath.   Family history of CAD in her mother Hyperlipidemia Prediabetes Multiple PVCs on ECG. Hypothyroidism on Synthroid.  Cataract surgery done Left shoulder   Energy down.  Lung referral  Never tobacco.   Past Medical History:  Diagnosis Date   Arthritis    Graves disease     Past Surgical History:  Procedure Laterality Date   COLONOSCOPY  10/06/2005   SHOULDER SURGERY     TUBAL LIGATION  10/06/1992    Current Medications: Current Meds  Medication Sig   Ascorbic Acid (VITAMIN C) 500 MG CAPS    cholecalciferol (VITAMIN D) 1000 UNITS tablet Take 1,400 Units by mouth daily.   Eflornithine HCl (VANIQA) 13.9 % cream Apply topically 2 (two) times daily.   hydrocortisone 2.5 % cream APPLY TO THE AFFECTED AREA ON THE FACE TWICE DAILY AS NEEDED   Iron, Ferrous Sulfate, 325 (65 Fe) MG TABS    levothyroxine (SYNTHROID) 50 MCG tablet Take by mouth once a week.   levothyroxine (SYNTHROID, LEVOTHROID) 75 MCG tablet Take 75 mcg by mouth daily before breakfast.   LEXAPRO 10 MG tablet    Menaquinone-7 (VITAMIN K2) 100 MCG CAPS    Multiple Vitamins-Minerals (CENTRUM SILVER 50+WOMEN) TABS    Oxymetazoline HCl (RHOFADE) 1 % CREA APPLY ON THE  SKIN AS DIRECTED APPLY AS DIRECTED TO ROSACEA   Zinc 50 MG TABS Take by mouth.     Allergies:   Bacitra-neomycin-polymyxin-hc, Scopolamine, Penicillins, and Sulfa antibiotics   Social History   Socioeconomic History   Marital status: Married    Spouse name: Not on file   Number of children: Not on file   Years of education: Not on file   Highest education level: Not on file  Occupational History   Not on file  Tobacco Use   Smoking status: Never   Smokeless tobacco: Never  Vaping Use   Vaping Use: Never used  Substance and Sexual Activity   Alcohol use: No    Alcohol/week: 0.0 standard drinks of alcohol   Drug use: No   Sexual activity: Not on file  Other Topics Concern   Not on file  Social History Narrative   Not on file   Social Determinants of Health   Financial Resource Strain: Not on file  Food Insecurity: Not on file  Transportation Needs: Not on file  Physical Activity: Not on file  Stress: Not on file  Social Connections: Not on file     Family History: The patient's family history includes Colon cancer (age of onset: 30) in her father. There is no history of Esophageal cancer, Rectal cancer, Stomach cancer, or Colon polyps.  ROS:   Please see  the history of present illness.    No syncope no bleeding all other systems reviewed and are negative.  EKGs/Labs/Other Studies Reviewed:    The following studies were reviewed today: Coronary score as above  EKG:  EKG Interpretation Date/Time:  Monday April 13 2023 09:52:27 EDT Ventricular Rate:  56 PR Interval:  136 QRS Duration:  82 QT Interval:  390 QTC Calculation: 376 R Axis:   96  Text Interpretation: Sinus bradycardia Right atrial enlargement Rightward axis Pulmonary disease pattern T wave abnormality, consider inferolateral ischemia No significant change since last tracing Confirmed by Donato Schultz ([id number]) on 04/13/2023 10:02:40 AM   Outside ECG-sinus rhythm heart rate 57 with nonspecific ST-T wave  changes.  Prior to that showed sinus rhythm 61 with occasional PACs.  Recent Labs: 12/24/2022: Hemoglobin 13.0; Platelets 301.0  Recent Lipid Panel No results found for: "CHOL", "TRIG", "HDL", "CHOLHDL", "VLDL", "LDLCALC", "LDLDIRECT"   Risk Assessment/Calculations:               Physical Exam:    VS:  BP 112/70   Pulse (!) 57   Ht 5' 2.5" (1.588 m)   Wt 112 lb (50.8 kg)   SpO2 96%   BMI 20.16 kg/m     Wt Readings from Last 3 Encounters:  04/13/23 112 lb (50.8 kg)  12/24/22 116 lb (52.6 kg)  05/20/21 115 lb (52.2 kg)     GEN:  Thin in no acute distress HEENT: Normal NECK: No JVD; No carotid bruits LYMPHATICS: No lymphadenopathy CARDIAC: RRR, no murmurs, rubs, gallops RESPIRATORY:  Clear to auscultation without rales, wheezing or rhonchi  ABDOMEN: Soft, non-tender, non-distended MUSCULOSKELETAL:  No edema; No deformity  SKIN: Warm and dry NEUROLOGIC:  Alert and oriented x 3 PSYCHIATRIC:  Normal affect   ASSESSMENT:    1. Coronary artery disease, unspecified vessel or lesion type, unspecified whether angina present, unspecified whether native or transplanted heart   2. DOE (dyspnea on exertion)   3. Precordial pain   4. Pre-procedure lab exam    PLAN:    In order of problems listed above:  Precordial chest pain with coronary artery disease, family history, prediabetes, hyperlipidemia - We will go ahead and order a full coronary CT scan. - This will help [place] assess her plaque volume.  Once we have these results, we will likely start medicine such as Crestor 10 mg once a day for hyperlipidemia. Aspirin 81 mg as well. -Your symptoms could be GI related as well.  We will check cardiac status out first however.  Shortness of breath with activity - Checking echocardiogram to evaluate pump function. - She does have a pulmonary referral in place as well with breathing test.  Never smoked.           Medication Adjustments/Labs and Tests Ordered: Current  medicines are reviewed at length with the patient today.  Concerns regarding medicines are outlined above.  Orders Placed This Encounter  Procedures   CT CORONARY MORPH W/CTA COR W/SCORE W/CA W/CM &/OR WO/CM   Basic metabolic panel   EKG 12-Lead   ECHOCARDIOGRAM COMPLETE   No orders of the defined types were placed in this encounter.   Patient Instructions  Medication Instructions:  The current medical regimen is effective;  continue present plan and medications.  *If you need a refill on your cardiac medications before your next appointment, please call your pharmacy*   Lab Work: Please have blood work today (BMP)  If you have labs (blood work) drawn today  and your tests are completely normal, you will receive your results only by: MyChart Message (if you have MyChart) OR A paper copy in the mail If you have any lab test that is abnormal or we need to change your treatment, we will call you to review the results.   Testing/Procedures: Your physician has requested that you have an echocardiogram. Echocardiography is a painless test that uses sound waves to create images of your heart. It provides your doctor with information about the size and shape of your heart and how well your heart's chambers and valves are working. This procedure takes approximately one hour. There are no restrictions for this procedure. Please do NOT wear cologne, perfume, aftershave, or lotions (deodorant is allowed). Please arrive 15 minutes prior to your appointment time.    Your cardiac CT will be scheduled at:   [facility] [address] [place], [place] [address] [phone]  Please arrive at the [facility] Entrance (Entrance C2) of [facility] 30 minutes prior to test start time. You can use the FREE valet parking offered at entrance C (encouraged to control the heart rate for the test)  Proceed to the [facility] Radiology Department (first floor) to  check-in and test prep.  All radiology patients and guests should use entrance C2 at [facility], accessed from [facility], even though the hospital's physical address listed is [address]    Please follow these instructions carefully (unless otherwise directed):  An IV will be required for this test and Nitroglycerin will be given.  Hold all erectile dysfunction medications at least 3 days (72 hrs) prior to test. (Ie viagra, cialis, sildenafil, tadalafil, etc)   On the Night Before the Test: Be sure to Drink plenty of water. Do not consume any caffeinated/decaffeinated beverages or chocolate 12 hours prior to your test. Do not take any antihistamines 12 hours prior to your test.  On the Day of the Test: Drink plenty of water until 1 hour prior to the test. Do not eat any food 1 hour prior to test. You may take your regular medications prior to the test.  Take metoprolol (Lopressor) two hours prior to test. If you take Furosemide/Hydrochlorothiazide/Spironolactone, please HOLD on the morning of the test. FEMALES- please wear underwire-free bra if available, avoid dresses & tight clothing  After the Test: Drink plenty of water. After receiving IV contrast, you may experience a mild flushed feeling. This is normal. On occasion, you may experience a mild rash up to 24 hours after the test. This is not dangerous. If this occurs, you can take Benadryl 25 mg and increase your fluid intake. If you experience trouble breathing, this can be serious. If it is severe call 911 IMMEDIATELY. If it is mild, please call our office. If you take any of these medications: Glipizide/Metformin, Avandament, Glucavance, please do not take 48 hours after completing test unless otherwise instructed.  We will call to schedule your test 2-4 weeks out understanding that some insurance companies will need an authorization prior to the service being performed.   For more  information and frequently asked questions, please visit our website : [email or web address]  For non-scheduling related questions, please contact the cardiac imaging nurse navigator should you have any questions/concerns: Rockwell [place], Cardiac Imaging Nurse Navigator Larey Brick, Cardiac Imaging Nurse Navigator [facility] Direct Office Dial: [phone]   For scheduling needs, including cancellations and rescheduling, please call [place], [phone].  Follow-Up: At [facility], you and your health needs are our priority.  As part of our continuing mission to provide you with exceptional heart care, we have created designated Provider Care Teams.  These Care Teams include your primary Cardiologist (physician) and Advanced Practice Providers (APPs -  Physician Assistants and Nurse Practitioners) who all work together to provide you with the care you need, when you need it.  We recommend signing up for the patient portal called "MyChart".  Sign up information is provided on this After Visit Summary.  MyChart is used to connect with patients for Virtual Visits (Telemedicine).  Patients are able to view lab/test results, encounter notes, upcoming appointments, etc.  Non-urgent messages can be sent to your provider as well.   To learn more about what you can do with MyChart, go to [email or web address].    Your next appointment:   1 year(s)  Provider:   Donato Schultz, MD        Signed, Donato Schultz, MD  04/13/2023 10:27 AM    [facility]

## 2023-04-13 NOTE — Telephone Encounter (Signed)
Calling patient to schedule her Cardiac CT scan. Appointment made for July 16. She verbalized possession of instructions for Dr. Anne Fu office and will call if she has any questions regarding her instructions.  She will arrive at noon for her appointment and knows that the RN navigators will NOT be reaching out.  Larey Brick RN Navigator Cardiac Imaging [facility] [phone] Office [phone] Cell

## 2023-04-13 NOTE — Patient Instructions (Addendum)
Medication Instructions:  The current medical regimen is effective;  continue present plan and medications.  *If you need a refill on your cardiac medications before your next appointment, please call your pharmacy*   Lab Work: Please have blood work today (BMP)  If you have labs (blood work) drawn today and your tests are completely normal, you will receive your results only by: MyChart Message (if you have MyChart) OR A paper copy in the mail If you have any lab test that is abnormal or we need to change your treatment, we will call you to review the results.   Testing/Procedures: Your physician has requested that you have an echocardiogram. Echocardiography is a painless test that uses sound waves to create images of your heart. It provides your doctor with information about the size and shape of your heart and how well your heart's chambers and valves are working. This procedure takes approximately one hour. There are no restrictions for this procedure. Please do NOT wear cologne, perfume, aftershave, or lotions (deodorant is allowed). Please arrive 15 minutes prior to your appointment time.    Your cardiac CT will be scheduled at:   [facility] [address] [place], [place] [address] [phone]  Please arrive at the [facility] En[facility] (Entrance C2) of [facility] 30 minutes prior to test start time. You can use the FREE valet parking offered at entrance C (encouraged to control the heart rate for the test)  Proceed to the [facility] Radiology Department (first floor) to check-in and test prep.  All radiology patients and guests should use entrance C2 at [facility], accessed from [facility], even though the hospital's physical address listed is [address]    Please follow these instructions carefully (unless otherwise directed):  An IV will be required for this test and Nitroglycerin will be given.   Hold all erectile dysfunction medications at least 3 days (72 hrs) prior to test. (Ie viagra, cialis, sildenafil, tadalafil, etc)   On the Night Before the Test: Be sure to Drink plenty of water. Do not consume any caffeinated/decaffeinated beverages or chocolate 12 hours prior to your test. Do not take any antihistamines 12 hours prior to your test.  On the Day of the Test: Drink plenty of water until 1 hour prior to the test. Do not eat any food 1 hour prior to test. You may take your regular medications prior to the test.  Take metoprolol (Lopressor) two hours prior to test. If you take Furosemide/Hydrochlorothiazide/Spironolactone, please HOLD on the morning of the test. FEMALES- please wear underwire-free bra if available, avoid dresses & tight clothing  After the Test: Drink plenty of water. After receiving IV contrast, you may experience a mild flushed feeling. This is normal. On occasion, you may experience a mild rash up to 24 hours after the test. This is not dangerous. If this occurs, you can take Benadryl 25 mg and increase your fluid intake. If you experience trouble breathing, this can be serious. If it is severe call 911 IMMEDIATELY. If it is mild, please call our office. If you take any of these medications: Glipizide/Metformin, Avandament, Glucavance, please do not take 48 hours after completing test unless otherwise instructed.  We will call to schedule your test 2-4 weeks out understanding that some insurance companies will need an authorization prior to the service being performed.   For more information and frequently asked questions, please visit our website : [email or web address]  For non-scheduling related questions, please contact the cardiac imaging nurse navigator should you have any questions/concerns: Rockwell Alexandria, Cardiac Imaging Nurse Navigator Larey Brick, Cardiac Imaging Nurse Navigator [facility] Direct  Office Dial: [phone]   For scheduling needs, including cancellations and rescheduling, please call [place], [phone].   Follow-Up: At [facility], you and your health needs are our priority.  As part of our continuing mission to provide you with exceptional heart care, we have created designated Provider Care Teams.  These Care Teams include your primary Cardiologist (physician) and Advanced Practice Providers (APPs -  Physician Assistants and Nurse Practitioners) who all work together to provide you with the care you need, when you need it.  We recommend signing up for the patient portal called "MyChart".  Sign up information is provided on this After Visit Summary.  MyChart is used to connect with patients for Virtual Visits (Telemedicine).  Patients are able to view lab/test results, encounter notes, upcoming appointments, etc.  Non-urgent messages can be sent to your provider as well.   To learn more about what you can do with MyChart, go to [email or web address].    Your next appointment:   1 year(s)  Provider:   Donato Schultz, MD

## 2023-04-20 ENCOUNTER — Encounter (HOSPITAL_COMMUNITY): Payer: Self-pay

## 2023-04-21 ENCOUNTER — Ambulatory Visit (HOSPITAL_COMMUNITY)
Admission: RE | Admit: 2023-04-21 | Discharge: 2023-04-21 | Disposition: A | Discharge status: Home / Self Care | Payer: Medicare Other | Source: Ambulatory Visit | Attending: Cardiology | Admitting: Cardiology

## 2023-04-21 DIAGNOSIS — R072 Precordial pain: Secondary | ICD-10-CM | POA: Insufficient documentation

## 2023-04-21 DIAGNOSIS — R0609 Other forms of dyspnea: Secondary | ICD-10-CM | POA: Insufficient documentation

## 2023-04-21 MED ORDER — IOHEXOL 350 MG/ML SOLN
95.0000 mL | Freq: Once | INTRAVENOUS | Status: AC | PRN
  Administered 2023-04-21: 95 mL via INTRAVENOUS

## 2023-04-21 MED ORDER — NITROGLYCERIN 0.4 MG SL SUBL
0.8000 mg | SUBLINGUAL_TABLET | Freq: Once | SUBLINGUAL | Status: AC
  Administered 2023-04-21: 0.8 mg via SUBLINGUAL

## 2023-04-21 MED ORDER — NITROGLYCERIN 0.4 MG SL SUBL
SUBLINGUAL_TABLET | SUBLINGUAL | Status: AC
  Filled 2023-04-21: qty 2

## 2023-05-01 ENCOUNTER — Telehealth: Payer: Self-pay | Admitting: *Deleted

## 2023-05-01 DIAGNOSIS — I2510 Atherosclerotic heart disease of native coronary artery without angina pectoris: Secondary | ICD-10-CM

## 2023-05-01 DIAGNOSIS — Z789 Other specified health status: Secondary | ICD-10-CM

## 2023-05-01 NOTE — Telephone Encounter (Signed)
-----   Message from Donato Schultz sent at 04/29/2023  5:59 AM EDT ----- Please refer to lipid clinic to discuss alternatives to statins. ----- Message ----- From: Sharin Grave, RN Sent: 04/28/2023   4:11 PM EDT To: Jake Bathe, MD; Koren Shiver, DO  Spoke with pt who is aware of results.  She prefers to not start a statin at this time due to already having "joint pain" and asks if there is anything else she can take instead.  Advised I will forward this information to Dr Anne Fu for review.  Advised to keep appt as scheduled for echo.  Requested she f/u with PCP for further evaluation of chest pain that she feels when laying down.

## 2023-05-07 ENCOUNTER — Ambulatory Visit (HOSPITAL_COMMUNITY): Payer: Medicare Other | Attending: Cardiology

## 2023-05-07 DIAGNOSIS — R0609 Other forms of dyspnea: Secondary | ICD-10-CM | POA: Insufficient documentation

## 2023-05-07 DIAGNOSIS — R072 Precordial pain: Secondary | ICD-10-CM | POA: Diagnosis not present

## 2023-05-07 LAB — ECHOCARDIOGRAM COMPLETE
Area-P 1/2: 3.31 cm2
S' Lateral: 2.5 cm

## 2023-05-11 ENCOUNTER — Encounter: Payer: Self-pay | Admitting: Cardiology

## 2023-05-22 ENCOUNTER — Ambulatory Visit: Payer: Medicare Other | Attending: Internal Medicine | Admitting: Student

## 2023-05-22 ENCOUNTER — Encounter: Payer: Self-pay | Admitting: Student

## 2023-05-22 VITALS — BP 110/68 | HR 59 | Ht 62.0 in | Wt 118.6 lb

## 2023-05-22 DIAGNOSIS — Z789 Other specified health status: Secondary | ICD-10-CM | POA: Diagnosis present

## 2023-05-22 DIAGNOSIS — E785 Hyperlipidemia, unspecified: Secondary | ICD-10-CM | POA: Diagnosis not present

## 2023-05-22 MED ORDER — ROSUVASTATIN CALCIUM 10 MG PO TABS: 10.0000 mg | ORAL_TABLET | Freq: Every day | ORAL | 3 refills | Status: DC

## 2023-05-22 NOTE — Progress Notes (Unsigned)
Patient ID: BRECKIN BRUN                 DOB: 1955/10/28                    MRN: [id number]      HPI: Dawn Burns is a 67 y.o. female patient referred to lipid clinic by Dr.Skains. PMH is significant for arthritis, grave's disease, dyslipidemia, CAD, Coronary calcium score of 312. This was 89 percentile   CAC scoring and ECHO done July/16 and 08/01 respectively. Patient was put on Crestor 10 mg daily patient is afraid of myalgia as she has baseline joint pain from arthritis. Medication dispense hx shows patient has not filled her prescription for Crestor yet.    Patient was here today for lipid clinic. Reports her husband fel;t terrible after stating statins so she does not want to go on any statins. Lately she has been dealing with lots of health issue ( anemia, diarrhea, bleating, dehydration and rectal bleeding, depression, low energy) wanted to get that addressed. We discussed CAC sore, LDLc level, family hx and future risk of ASCVD. Reviewed options for lowering LDL cholesterol, including , statins ezetimibe, PCSK-9 inhibitors, bempedoic acid and inclisiran.  Discussed mechanisms of action, dosing, side effects and potential decreases in LDL cholesterol.  Also reviewed cost information and potential options for patient assistance.  Current Medications: none  Intolerances: none  Risk Factors: dyslipidemia, CAD, Coronary calcium score of 312. This was 89 percentile LDL goal: <70 mg/dl   Diet: low fat  heart healthy food, loves fruits and vegetables    Exercise:  silver sneakers  3 times week for 1 hour Easily gets 10-15 k steps per day   Family History: Mother- CAD, MI in her 79's, mini strokes  Father: colon cancer  Brother: HTN,  Social History:  Alcohol: none  Smoking : none   Labs:  07/29/2022- LDLc 132, TC 230, HDL 88, TG 72 - was not on any therapy   Past Medical History:  Diagnosis Date   Arthritis    Graves disease     Current Outpatient Medications on  File Prior to Visit  Medication Sig Dispense Refill   Ascorbic Acid (VITAMIN C) 500 MG CAPS      cholecalciferol (VITAMIN D) 1000 UNITS tablet Take 1,400 Units by mouth daily.     Eflornithine HCl (VANIQA) 13.9 % cream Apply topically 2 (two) times daily.     hydrocortisone 2.5 % cream APPLY TO THE AFFECTED AREA ON THE FACE TWICE DAILY AS NEEDED     Iron, Ferrous Sulfate, 325 (65 Fe) MG TABS      levothyroxine (SYNTHROID) 50 MCG tablet Take by mouth once a week.     levothyroxine (SYNTHROID, LEVOTHROID) 75 MCG tablet Take 75 mcg by mouth daily before breakfast.     Menaquinone-7 (VITAMIN K2) 100 MCG CAPS      Multiple Vitamins-Minerals (CENTRUM SILVER 50+WOMEN) TABS      Oxymetazoline HCl (RHOFADE) 1 % CREA APPLY ON THE SKIN AS DIRECTED APPLY AS DIRECTED TO ROSACEA     Zinc 50 MG TABS Take by mouth.     No current facility-administered medications on file prior to visit.    Allergies  Allergen Reactions   Bacitra-Neomycin-Polymyxin-Hc Itching   Scopolamine Other (See Comments)    Other Reaction(s): Other  Severe dry mouth   Penicillins Rash   Sulfa Antibiotics Rash    Assessment/Plan:  1. Hyperlipidemia -  Problem  Hyperlipidemia  Current Medications: none  Intolerances: none  Risk Factors: dyslipidemia, CAD, Coronary calcium score of 312 (89 percentile) 10 years ASCVD risk score >5 % LDL goal: <70 mg/dl     Hyperlipidemia Assessment and plan:  LDL goal: < 70 mg/dl last LDLc 147 mg/dl was not on any therapy (07/29/2022) Discussed LDLc lowering options (statins, Zetia, PCSK-9 inhibitors, bempedoic acid and inclisiran); cost, dosing efficacy, side effects  Lately she has been dealing with lots of health issue ( anemia, diarrhea, bleating, dehydration and rectal bleeding, depression, low energy)  Does not want to go on Zetia as it may cause diarrhea but willing to try low dose statin  Low dose Crestor will not be enough to lower LDLc to the goal and due to ongoing stomach  problem Zetia may not be good to consider reasonable to add PCSK9i to max tolerated statin for primary prevention  Patient to start taking Crestor 10 mg daily,will follow up in 2 weeks via phone to check tolerability Will apply for PA for PCSK9i; will inform patient upon approval (prefers MyChart message) Lipid lab due in 2-3 months     Thank you,  Carmela Hurt, Pharm.D [facility] Division of [facility] [address] [place], [place] [address]  Phone: [phone]; Fax: [phone]

## 2023-05-22 NOTE — Patient Instructions (Signed)
Your Results:             Your most recent labs Goal  Total Cholesterol 230 < 200  Triglycerides 72 < 150  HDL (happy/good cholesterol) 88 > 40  LDL (lousy/bad cholesterol 132 < 70   Medication changes: start taking Crestor 10 mg daily  We will start the process to get PCSK9i (Repatha or Praluent)  covered by your insurance.  Once the prior authorization is complete, we will call you to let you know and confirm pharmacy information.    Praluent is a cholesterol medication that improved your body's ability to get rid of "bad cholesterol" known as LDL. It can lower your LDL up to 60%. It is an injection that is given under the skin every 2 weeks. The most common side effects of Praluent include runny nose, symptoms of the common cold, rarely flu or flu-like symptoms, back/muscle pain in about 3-4% of the patients, and redness, pain, or bruising at the injection site.    Repatha is a cholesterol medication that improved your body's ability to get rid of "bad cholesterol" known as LDL. It can lower your LDL up to 60%! It is an injection that is given under the skin every 2 weeks. The medication often requires a prior authorization from your insurance company. We will take care of submitting all the necessary information to your insurance company to get it approved. The most common side effects of Repatha include runny nose, symptoms of the common cold, rarely flu or flu-like symptoms, back/muscle pain in about 3-4% of the patients, and redness, pain, or bruising at the injection site.   Lab orders: We want to repeat labs after 2-3 months.  We will send you a lab order to remind you once we get closer to that time.

## 2023-05-22 NOTE — Assessment & Plan Note (Addendum)
Assessment and plan:  LDL goal: < 70 mg/dl last LDLc 604 mg/dl was not on any therapy (07/29/2022) Discussed LDLc lowering options (statins, Zetia, PCSK-9 inhibitors, bempedoic acid and inclisiran); cost, dosing efficacy, side effects  Lately she has been dealing with lots of health issue ( anemia, diarrhea, bleating, dehydration and rectal bleeding, depression, low energy)  Does not want to go on Zetia as it may cause diarrhea but willing to try low dose statin  Low dose Crestor will not be enough to lower LDLc to the goal and due to ongoing stomach problem Zetia may not be good to consider reasonable to add PCSK9i to max tolerated statin for primary prevention  Patient to start taking Crestor 10 mg daily,will follow up in 2 weeks via phone to check tolerability Will apply for PA for PCSK9i; will inform patient upon approval (prefers MyChart message) Lipid lab due in 2-3 months

## 2023-05-27 ENCOUNTER — Encounter: Payer: Self-pay | Admitting: Pulmonary Disease

## 2023-05-27 ENCOUNTER — Ambulatory Visit (INDEPENDENT_AMBULATORY_CARE_PROVIDER_SITE_OTHER): Payer: Medicare Other | Admitting: Pulmonary Disease

## 2023-05-27 VITALS — BP 110/72 | HR 66 | Temp 98.1°F | Ht 62.0 in | Wt 117.2 lb

## 2023-05-27 DIAGNOSIS — J479 Bronchiectasis, uncomplicated: Secondary | ICD-10-CM

## 2023-05-27 LAB — PULMONARY FUNCTION TEST
DL/VA % pred: 109 %
DL/VA: 4.61 ml/min/mmHg/L
DLCO cor % pred: 93 %
DLCO cor: 17.5 ml/min/mmHg
DLCO unc % pred: 93 %
DLCO unc: 17.5 ml/min/mmHg
FEF 25-75 Post: 1.61 L/sec
FEF 25-75 Pre: 1.28 L/sec
FEF2575-%Change-Post: 26 %
FEF2575-%Pred-Post: 84 %
FEF2575-%Pred-Pre: 66 %
FEV1-%Change-Post: 7 %
FEV1-%Pred-Post: 75 %
FEV1-%Pred-Pre: 69 %
FEV1-Post: 1.64 L
FEV1-Pre: 1.53 L
FEV1FVC-%Change-Post: 1 %
FEV1FVC-%Pred-Pre: 96 %
FEV6-%Change-Post: 7 %
FEV6-%Pred-Post: 79 %
FEV6-%Pred-Pre: 74 %
FEV6-Post: 2.21 L
FEV6-Pre: 2.06 L
FEV6FVC-%Pred-Post: 104 %
FEV6FVC-%Pred-Pre: 104 %
FVC-%Change-Post: 6 %
FVC-%Pred-Post: 76 %
FVC-%Pred-Pre: 72 %
FVC-Post: 2.21 L
FVC-Pre: 2.08 L
Post FEV1/FVC ratio: 75 %
Post FEV6/FVC ratio: 100 %
Pre FEV1/FVC ratio: 74 %
Pre FEV6/FVC Ratio: 100 %
RV % pred: 99 %
RV: 2.05 L
TLC % pred: 87 %
TLC: 4.22 L

## 2023-05-27 NOTE — Progress Notes (Signed)
Full PFT performed today. °

## 2023-05-27 NOTE — Patient Instructions (Signed)
I am glad you are stable with your breathing Your lung function test and labs are normal which is good news Will continue to observe the inflammation that we see in the lung which can be managed conservatively Will order high-res CT in 6 months Follow-up after CT scan in 6 months

## 2023-05-27 NOTE — Patient Instructions (Signed)
Full PFT performed today. °

## 2023-05-27 NOTE — Progress Notes (Addendum)
Dawn Burns    [id number]    09/07/1956  Primary Care Physician:Masneri, Wille Celeste, DO  Referring Physician: Koren Shiver, DO [address] [facility] [address] [place],  [place] [address]  Chief complaint: Consult for abnormal CT  HPI: 67 y.o. who  has a past medical history of Arthritis and Graves disease.   Referred for evaluation of abnormal CT.  She had a CT scan of the chest as a follow-up for lung nodules which showed mild tree-in-bud nodularity, bronchiectasis and mild scarring.  She has occasional cough with mucus in the lung.  No dyspnea History notable for arthritis for which she saw Dr. Dierdre Forth around 2020 and was told that she does not have any significant rheumatologic problem.  Pets: No pets.  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  No feather pillows or comforters Smoking history: Never smoker.  Exposed to secondhand smoke as a child Travel history: From [place].  No significant travel history Relevant family history: No family history of lung disease  Interim history: Here for review of PFTs States that breathing is doing well with no issues.  Outpatient Encounter Medications as of 05/27/2023  Medication Sig   Ascorbic Acid (VITAMIN C) 500 MG CAPS    cholecalciferol (VITAMIN D) 1000 UNITS tablet Take 1,400 Units by mouth daily.   Eflornithine HCl (VANIQA) 13.9 % cream Apply topically 2 (two) times daily.   hydrocortisone 2.5 % cream APPLY TO THE AFFECTED AREA ON THE FACE TWICE DAILY AS NEEDED   Iron, Ferrous Sulfate, 325 (65 Fe) MG TABS    levothyroxine (SYNTHROID) 50 MCG tablet Take by mouth once a week.   levothyroxine (SYNTHROID, LEVOTHROID) 75 MCG tablet Take 75 mcg by mouth daily before breakfast.   Menaquinone-7 (VITAMIN K2) 100 MCG CAPS    Multiple Vitamins-Minerals (CENTRUM SILVER 50+WOMEN) TABS    Oxymetazoline HCl (RHOFADE) 1 % CREA APPLY ON THE  SKIN AS DIRECTED APPLY AS DIRECTED TO ROSACEA   rosuvastatin (CRESTOR) 10 MG tablet Take 1 tablet (10 mg total) by mouth daily.   Zinc 50 MG TABS Take by mouth.   No facility-administered encounter medications on file as of 05/27/2023.    Physical Exam: Blood pressure 118/72, pulse 63, temperature 97.9 F (36.6 C), temperature source Oral, height 5' 2.5" (1.588 m), weight 116 lb (52.6 kg), SpO2 96 %. Gen:      No acute distress HEENT:  EOMI, sclera anicteric Neck:     No masses; no thyromegaly Lungs:    Clear to auscultation bilaterally; normal respiratory effort CV:         Regular rate and rhythm; no murmurs Abd:      + bowel sounds; soft, non-tender; no palpable masses, no distension Ext:    No edema; adequate peripheral perfusion Skin:      Warm and dry; no rash Neuro: alert and oriented x 3 Psych: normal mood and affect  Data Reviewed: Imaging: Cardiac CT 04/27/2022-right lower lobe pulmonary nodule, mild bronchiectasis CT chest 12/03/2022-evolution of right lower lobe opacity into scar, bronchiectasis in the right lower lobe, right middle lobe with tree-in-bud I had reviewed the images personally.  PFTs: 05/27/2023 FVC 2.21 [76%], FEV1 1.64 [95%], F/F75, TLC 4.22 [87%], DLCO 17.50 [93%] Normal test   Labs: 12/24/2022-CBC, IgE, quantitative immunoglobulin, cystic fibrosis, alpha-1 antitrypsin, rheumatoid factor and CCP-negative.  Assessment:  Abnormal CT Initially  noted to have pulmonary nodule on cardiac CT and on follow-up appears to be more scarring with associated bronchiectasis, tree-in-bud opacities  Findings are suggestive of chronic MAI infection though she does not have significant symptoms.  PFTs look normal.  Given mild findings very likely observe Workup for bronchiectasis in March 2024 with CBC, IgE, immunoglobulin, cystic fibrosis, alpha-1 antitrypsin, rheumatoid factor and CCP is negative.  Plan/Recommendations: Follow-up CT in 6 months  Chilton Greathouse  MD [facility] 05/27/2023, 2:55 PM  CC: Koren Shiver, DO

## 2023-06-05 ENCOUNTER — Telehealth: Payer: Self-pay | Admitting: Pharmacist

## 2023-06-05 NOTE — Telephone Encounter (Signed)
Call to assess statin tolerance, reports she is tolerating Crestor 10 mg daily ok. Taking it for 1 week now and no s/e yet. she wants [place] to go ahead submit PA for PCSK9i.  Also concern about co-pay of injectables.  Once we have approval will run the test claim to get rough co-pay estimate

## 2023-06-09 ENCOUNTER — Telehealth: Payer: Self-pay

## 2023-06-09 ENCOUNTER — Other Ambulatory Visit (HOSPITAL_COMMUNITY): Payer: Self-pay

## 2023-06-09 DIAGNOSIS — Z789 Other specified health status: Secondary | ICD-10-CM

## 2023-06-09 DIAGNOSIS — I2510 Atherosclerotic heart disease of native coronary artery without angina pectoris: Secondary | ICD-10-CM

## 2023-06-09 DIAGNOSIS — E7849 Other hyperlipidemia: Secondary | ICD-10-CM

## 2023-06-09 NOTE — Telephone Encounter (Addendum)
If current statin therapy is considered to be moderate intensity, SAMS-CI score is not required.

## 2023-06-09 NOTE — Telephone Encounter (Signed)
Pharmacy Patient Advocate Encounter   Received notification from Physician's Office that prior authorization for REPATHA is required/requested.   Insurance verification completed.   The patient is insured through [employer] .   Per test claim: PA required; PA submitted to [employer] [facility] via CoverMyMeds Key/confirmation #/[id number] Status is pending

## 2023-06-09 NOTE — Telephone Encounter (Signed)
  Can you confirm the start date of Crestor 10 mg?

## 2023-06-09 NOTE — Telephone Encounter (Signed)
   Plan needs to see a SAMS-CI score of 7 or higher in chart notes somewhere in order to approve PCSK9i. Please update documentation.

## 2023-06-09 NOTE — Telephone Encounter (Signed)
PA request has been Submitted. New Encounter created for follow up. For additional info see Pharmacy Prior Auth telephone encounter from 06/09/23.

## 2023-06-10 NOTE — Telephone Encounter (Signed)
Insurance wants patient to try moderate intensity statin for 3 months before proceeding to PCSK9i. Patient has started taking Crestor 10 mg mid Aug, will order lab for Sep 02, 2023. If LDLc stays above the goal will apply for PA for PCSK9i.

## 2023-06-10 NOTE — Telephone Encounter (Signed)
Collaborated with RPH to answer CMM questions. Plan sent a fax asking for supporting documentation.     PLEASE ADVISE

## 2023-06-12 ENCOUNTER — Other Ambulatory Visit (HOSPITAL_COMMUNITY): Payer: Self-pay

## 2023-07-29 ENCOUNTER — Encounter: Payer: Self-pay | Admitting: Gastroenterology

## 2023-07-29 ENCOUNTER — Other Ambulatory Visit: Payer: Medicare Other

## 2023-07-29 ENCOUNTER — Ambulatory Visit (INDEPENDENT_AMBULATORY_CARE_PROVIDER_SITE_OTHER): Payer: Medicare Other | Admitting: Gastroenterology

## 2023-07-29 VITALS — BP 122/70 | HR 60 | Ht 62.0 in | Wt 116.4 lb

## 2023-07-29 DIAGNOSIS — R197 Diarrhea, unspecified: Secondary | ICD-10-CM | POA: Diagnosis not present

## 2023-07-29 DIAGNOSIS — R195 Other fecal abnormalities: Secondary | ICD-10-CM | POA: Diagnosis not present

## 2023-07-29 DIAGNOSIS — R143 Flatulence: Secondary | ICD-10-CM | POA: Insufficient documentation

## 2023-07-29 DIAGNOSIS — R140 Abdominal distension (gaseous): Secondary | ICD-10-CM

## 2023-07-29 NOTE — Progress Notes (Signed)
07/29/2023 FRETA YOSHINAGA [id number] 1956-02-20   HISTORY OF PRESENT ILLNESS: This is a 67 year old female who is a patient of Dr. Elana Alm.  She is here today with complaints of bowel movements that are very soft/mushy.  Reports a bad odor at times.  Also constantly feels bloated.  Feels like she is tired and has low energy.  Says that she has gained 6 to 8 pounds but she exercises and walks 5 to 10 miles a day.  She sees just very small amounts of bright red rectal bleeding on the toilet paper on occasion.  Historically she said when she was younger she always struggled with constipation, but it has changed over the past handful of years.  Also describes a lot of bloating.  Pancreatic fecal elastase negative in the past.  She is on thyroid medication is actually going to see endocrinology in the near future as well.  No improvement with colestipol in the past.  There is a component of lactose intolerance per her report.  She avoids a lot of dairy, especially certain items like ice cream.  Is under a lot of stress for about the past year.    Last colonoscopy was August 2022:  Impression: - Three colonic angiodysplastic lesions. - One 1 mm polyp in the transverse colon, removed with a cold biopsy forceps. Resected and retrieved. Clips ( MR conditional) were placed. - One 1 mm polyp in the transverse colon, removed with a cold biopsy forceps. Resected and retrieved. - Non- bleeding internal hemorrhoids.  Surgical [P], colon, transverse, polyp (2) - TUBULAR ADENOMA (ONE). - NO HIGH GRADE DYSPLASIA OR CARCINOMA. - COLONIC MUCOSA WITH BENIGN LYMPHOID AGGREGATE (ONE).  Past Medical History:  Diagnosis Date   Arthritis    Graves disease    Past Surgical History:  Procedure Laterality Date   COLONOSCOPY  10/06/2005   SHOULDER SURGERY     TUBAL LIGATION  10/06/1992    reports that she has never smoked. She has never used smokeless tobacco. She reports that she does not drink alcohol  and does not use drugs. family history includes Colon cancer (age of onset: 50) in her father. Allergies  Allergen Reactions   Bacitra-Neomycin-Polymyxin-Hc Itching   Scopolamine Other (See Comments)    Other Reaction(s): Other  Severe dry mouth   Penicillins Rash   Sulfa Antibiotics Rash      Outpatient Encounter Medications as of 07/29/2023  Medication Sig   Ascorbic Acid (VITAMIN C) 500 MG CAPS    cholecalciferol (VITAMIN D) 1000 UNITS tablet Take 1,400 Units by mouth daily.   hydrocortisone 2.5 % cream APPLY TO THE AFFECTED AREA ON THE FACE TWICE DAILY AS NEEDED   Iron, Ferrous Sulfate, 325 (65 Fe) MG TABS    levothyroxine (SYNTHROID) 50 MCG tablet Take by mouth once a week.   levothyroxine (SYNTHROID, LEVOTHROID) 75 MCG tablet Take 75 mcg by mouth daily before breakfast.   Multiple Vitamins-Minerals (CENTRUM SILVER 50+WOMEN) TABS    rosuvastatin (CRESTOR) 10 MG tablet Take 1 tablet (10 mg total) by mouth daily.   Selenium 200 MCG CAPS Take 1 capsule by mouth daily.   tretinoin (RETIN-A) 0.025 % cream SMARTSIG:1 Sparingly Topical Every Night PRN   Turmeric (QC TUMERIC COMPLEX PO) Take 1 capsule by mouth daily.   Zinc 50 MG TABS Take by mouth.   [DISCONTINUED] Eflornithine HCl (VANIQA) 13.9 % cream Apply topically 2 (two) times daily.   [DISCONTINUED] Menaquinone-7 (VITAMIN K2) 100 MCG CAPS    [  DISCONTINUED] Oxymetazoline HCl (RHOFADE) 1 % CREA APPLY ON THE SKIN AS DIRECTED APPLY AS DIRECTED TO ROSACEA   No facility-administered encounter medications on file as of 07/29/2023.     REVIEW OF SYSTEMS  : All other systems reviewed and negative except where noted in the History of Present Illness.   PHYSICAL EXAM: BP 122/70 (BP Location: Left Arm, Patient Position: Sitting, Cuff Size: Normal)   Pulse 60   Ht 5\' 2"  (1.575 m)   Wt 116 lb 6 oz (52.8 kg)   SpO2 97%   BMI 21.29 kg/m  General: Well developed white female in no acute distress Head: Normocephalic and  atraumatic Eyes:  Sclerae anicteric, conjunctiva pink. Ears: Normal auditory acuity Lungs: Clear throughout to auscultation; no W/R/R. Heart: Regular rate and rhythm; no M/R/G. Abdomen: Soft, non-distended.  BS present.  Non-tender. Musculoskeletal: Symmetrical with no gross deformities  Skin: No lesions on visible extremities Extremities: No edema  Neurological: Alert oriented x 4, grossly non-focal Psychological:  Alert and cooperative. Normal mood and affect  ASSESSMENT AND PLAN: *67 year old female with complaints of chronic loose/mushy stools at least for the past 3 years.  Historically she said when she was younger she always struggled with constipation, but it has changed over the past handful of years.  Also describes a lot of bloating.  Pancreatic fecal elastase negative in the past.  She is on thyroid medication is actually going to see endocrinology in the near future as well.  No improvement with colestipol in the past.  There is a component of lactose intolerance per her report.  She avoids a lot of dairy, especially certain items like ice cream.  Is under a lot of stress for about the past year.  Likely component of IBS.  Will check celiac labs.  Rule out infectious source with stool studies.  Will do SIBO breath test as well.  We discussed FODMAP diet and she would be willing to investigate that pending results of the above.   CC:  Masneri, Wille Celeste, DO

## 2023-07-29 NOTE — Patient Instructions (Signed)
Your provider has requested that you go to the basement level for lab work before leaving today. Press "B" on the elevator. The lab is located at the first door on the left as you exit the elevator.  You have been given a testing kit to check for small intestine bacterial overgrowth (SIBO) which is completed by a company named [facility]. Make sure to return your test in the mail using the return mailing label given to you along with the kit. The test order, your demographic and insurance information have all already been sent to the company. [facility] will collect an upfront charge of $99.74 for commercial insurance plans and $209.74 if you are paying cash. Make sure to discuss with [facility] PRIOR to having the test to see if they have gotten information from your insurance company as to how much your testing will cost out of pocket, if any. Please contact [facility] at phone number [phone] to get instructions regarding how to perform the test as our office is unable to give specific testing instructions.  _______________________________________________________  If your blood pressure at your visit was 140/90 or greater, please contact your primary care physician to follow up on this.  _______________________________________________________  If you are age 68 or older, your body mass index should be between 23-30. Your Body mass index is 21.29 kg/m. If this is out of the aforementioned range listed, please consider follow up with your Primary Care Provider.  If you are age 42 or younger, your body mass index should be between 19-25. Your Body mass index is 21.29 kg/m. If this is out of the aformentioned range listed, please consider follow up with your Primary Care Provider.   ________________________________________________________  The [facility] providers would like to encourage you to use [facility] to communicate with providers for non-urgent requests or questions.   Due to long hold times on the telephone, sending your provider a message by [facility] may be a faster and more efficient way to get a response.  Please allow 48 business hours for a response.  Please remember that this is for non-urgent requests.  _______________________________________________________

## 2023-07-30 ENCOUNTER — Other Ambulatory Visit: Payer: Medicare Other

## 2023-07-30 DIAGNOSIS — R197 Diarrhea, unspecified: Secondary | ICD-10-CM

## 2023-07-30 LAB — IGA: Immunoglobulin A: 262 mg/dL (ref 70–320)

## 2023-07-30 LAB — TISSUE TRANSGLUTAMINASE ABS,IGG,IGA
(tTG) Ab, IgA: <1 U/mL
(tTG) Ab, IgG: <1 U/mL

## 2023-08-02 LAB — GI PROFILE, STOOL, PCR

## 2023-08-13 MED ORDER — HYDROCORTISONE (PERIANAL) 2.5 % EX CREA: 1.0000 | TOPICAL_CREAM | Freq: Two times a day (BID) | CUTANEOUS | 1 refills | Status: AC

## 2023-08-13 NOTE — Addendum Note (Signed)
Addended by: Loretha Stapler on: 08/13/2023 01:36 PM   Modules accepted: Orders

## 2023-08-13 NOTE — Telephone Encounter (Signed)
It is my understanding that she has decided not to complete the SIBO.

## 2023-08-13 NOTE — Telephone Encounter (Signed)
I believe she is speaking of the SIBO test.  I will send the prescription to her pharmacy.

## 2023-08-14 NOTE — Telephone Encounter (Signed)
She said she spoke with the representatives with SIBO and they told her that she should not do the SIBO test that we provided but that there is an alternative.  I am not aware of any other SIBO test that she is describing.  Sorry this has been confusing.

## 2023-09-10 ENCOUNTER — Other Ambulatory Visit: Payer: Self-pay | Admitting: Cardiology

## 2023-11-30 ENCOUNTER — Other Ambulatory Visit: Payer: Medicare Other

## 2023-11-30 ENCOUNTER — Ambulatory Visit
Admission: RE | Admit: 2023-11-30 | Discharge: 2023-11-30 | Disposition: A | Discharge status: Home / Self Care | Payer: Medicare Other | Source: Ambulatory Visit | Attending: Pulmonary Disease | Admitting: Pulmonary Disease

## 2023-11-30 DIAGNOSIS — J479 Bronchiectasis, uncomplicated: Secondary | ICD-10-CM

## 2024-01-12 ENCOUNTER — Telehealth: Payer: Self-pay

## 2024-01-12 NOTE — Telephone Encounter (Signed)
   Pre-operative Risk Assessment    Patient Name: Dawn Burns  DOB: 02-May-1956 MRN: [id number]   Date of last office visit: 04/13/23 Donato Schultz, MD Date of next office visit: NONE   Request for Surgical Clearance    Procedure:   LEFT REVERSE SHOULDER ARTHROPLASTY  Date of Surgery:  Clearance TBD                                Surgeon:  DR Caryn Bee SUPPLE Surgeon's Group or Practice Name:  [facility] Phone number:  [phone] Fax number:  [phone] ATTN: Lawson Fiscal STONE   Type of Clearance Requested:   - Medical    Type of Anesthesia:  General    Additional requests/questions:    SignedMarlow Baars   01/12/2024, 6:29 PM

## 2024-01-13 ENCOUNTER — Telehealth: Payer: Self-pay | Admitting: *Deleted

## 2024-01-13 ENCOUNTER — Telehealth: Payer: Self-pay | Admitting: Pulmonary Disease

## 2024-01-13 NOTE — Telephone Encounter (Signed)
   Name: Dawn Burns  DOB: 17-Jan-1956  MRN: [id number]  Primary Cardiologist: Donato Schultz, MD   Preoperative team, please contact this patient and set up a phone call appointment for further preoperative risk assessment. Please obtain consent and complete medication review. Thank you for your help.  I confirm that guidance regarding antiplatelet and oral anticoagulation therapy has been completed and, if necessary, noted below.  None requested.  I also confirmed the patient resides in the state of [place]. As per [facility] Medical Board telemedicine laws, the patient must reside in the state in which the provider is licensed.   Ronney Asters, NP 01/13/2024, 8:09 AM [facility]

## 2024-01-13 NOTE — Telephone Encounter (Signed)
 Fax received from Dr. Francena Hanly with [facility] to perform a left reverse shoulder arthroplasty on patient.  Patient needs surgery clearance. Surgery is TBD. Patient was seen on 05/27/23. Office protocol is a risk assessment can be sent to surgeon if patient has been seen in 60 days or less.   She has appt pending with Dr. Isaiah Serge- will need to discuss risk assessment at this visit. There is nothing available any sooner. I sent fax back to [facility] informing them of this. Routing to clearance pool for f/u.

## 2024-01-13 NOTE — Telephone Encounter (Signed)
  Patient Consent for Virtual Visit    Dawn Burns has provided verbal consent on 01/13/2024 for a virtual visit (video or telephone).  CONSENT FOR VIRTUAL VISIT FOR:  Dawn Burns  By participating in this virtual visit I agree to the following:  I hereby voluntarily request, consent and authorize [facility] and its employed or contracted physicians, physician assistants, nurse practitioners or other licensed health care professionals (the Practitioner), to provide me with telemedicine health care services (the "Services") as deemed necessary by the treating Practitioner. I acknowledge and consent to receive the Services by the Practitioner via telemedicine. I understand that the telemedicine visit will involve communicating with the Practitioner through live audiovisual communication technology and the disclosure of certain medical information by electronic transmission. I acknowledge that I have been given the opportunity to request an in-person assessment or other available alternative prior to the telemedicine visit and am voluntarily participating in the telemedicine visit.  I understand that I have the right to withhold or withdraw my consent to the use of telemedicine in the course of my care at any time, without affecting my right to future care or treatment, and that the Practitioner or I may terminate the telemedicine visit at any time. I understand that I have the right to inspect all information obtained and/or recorded in the course of the telemedicine visit and may receive copies of available information for a reasonable fee.  I understand that some of the potential risks of receiving the Services via telemedicine include:  Delay or interruption in medical evaluation due to technological equipment failure or disruption; Information transmitted may not be sufficient (e.g. poor resolution of images) to allow for appropriate medical decision making by the  Practitioner; and/or  In rare instances, security protocols could fail, causing a breach of personal health information.  Furthermore, I acknowledge that it is my responsibility to provide information about my medical history, conditions and care that is complete and accurate to the best of my ability. I acknowledge that Practitioner's advice, recommendations, and/or decision may be based on factors not within their control, such as incomplete or inaccurate data provided by me or distortions of diagnostic images or specimens that may result from electronic transmissions. I understand that the practice of medicine is not an exact science and that Practitioner makes no warranties or guarantees regarding treatment outcomes. I acknowledge that a copy of this consent can be made available to me via my patient portal [facility] MyChart), or I can request a printed copy by calling the office of [facility].    I understand that my insurance will be billed for this visit.   I have read or had this consent read to me. I understand the contents of this consent, which adequately explains the benefits and risks of the Services being provided via telemedicine.  I have been provided ample opportunity to ask questions regarding this consent and the Services and have had my questions answered to my satisfaction. I give my informed consent for the services to be provided through the use of telemedicine in my medical care

## 2024-01-13 NOTE — Telephone Encounter (Signed)
 Patient scheduled 01/14/24

## 2024-01-14 ENCOUNTER — Ambulatory Visit: Attending: Cardiovascular Disease

## 2024-01-14 DIAGNOSIS — Z01810 Encounter for preprocedural cardiovascular examination: Secondary | ICD-10-CM | POA: Diagnosis present

## 2024-01-14 NOTE — Progress Notes (Signed)
 Virtual Visit via Telephone Note   Because of Dawn Burns co-morbid illnesses, she is at least at moderate risk for complications without adequate follow up.  This format is felt to be most appropriate for this patient at this time.  Due to technical limitations with video connection (technology), today's appointment will be conducted as an audio only telehealth visit, and Dawn Burns verbally agreed to proceed in this manner.   All issues noted in this document were discussed and addressed.  No physical exam could be performed with this format.  Evaluation Performed:  Preoperative cardiovascular risk assessment _____________   Date:  01/14/2024   Patient ID:  Dawn Burns, DOB Mar 02, 1956, MRN [id number] Patient Location:  Home Provider location:   Office  Primary Care Provider:  Koren Shiver, DO (Inactive) Primary Cardiologist:  Donato Schultz, MD  Chief Complaint / Patient Profile   68 y.o. y/o female with a h/o artery calcifications, HLD, arthritis, Graves' disease who is pending left reverse shoulder arthroplasty and presents today for telephonic preoperative cardiovascular risk assessment.  History of Present Illness    Dawn Burns is a 68 y.o. female who presents via audio/video conferencing for a telehealth visit today.  Pt was last seen in cardiology clinic on 04/13/2023 by Dr. Anne Fu.  At that time Dawn Burns was doing well but reported some shortness of breath and underwent a coronary CTA that showed nonobstructive CAD with recommendation to start Crestor but experienced myalgias and was referred to the Pharm.D for possible PCSK9.  Patient tolerated statin and insurance recommended continuing statin therapy for 3 months.  The patient is now pending procedure as outlined above. Since her last visit, she has been doing well with no new cardiac complaints.  She has been taking her Crestor daily and is due to have repeat lipids and LFT  completed.  She was advised to contact our office for an updated order to have this done.  She denies chest pain, shortness of breath, lower extremity edema, fatigue, palpitations, melena, hematuria, hemoptysis, diaphoresis, weakness, presyncope, syncope, orthopnea, and PND.   Past Medical History    Past Medical History:  Diagnosis Date   Arthritis    Graves disease    Past Surgical History:  Procedure Laterality Date   COLONOSCOPY  10/06/2005   SHOULDER SURGERY     TUBAL LIGATION  10/06/1992    Allergies  Allergies  Allergen Reactions   Bacitra-Neomycin-Polymyxin-Hc Itching   Scopolamine Other (See Comments)    Other Reaction(s): Other  Severe dry mouth   Penicillins Rash   Sulfa Antibiotics Rash    Home Medications    Prior to Admission medications   Medication Sig Start Date End Date Taking? Authorizing Provider  Ascorbic Acid (VITAMIN C) 500 MG CAPS  03/05/23   [provider]  cholecalciferol (VITAMIN D) 1000 UNITS tablet Take 1,400 Units by mouth daily.    [provider]  hydrocortisone (ANUSOL-HC) 2.5 % rectal cream Place 1 Application rectally 2 (two) times daily. 08/13/23   Zehr, Shanda Bumps D, PA-C  hydrocortisone 2.5 % cream APPLY TO THE AFFECTED AREA ON THE FACE TWICE DAILY AS NEEDED    [provider]  Iron, Ferrous Sulfate, 325 (65 Fe) MG TABS  03/05/23   [provider]  levothyroxine (SYNTHROID) 50 MCG tablet Take by mouth once a week.    [provider]  levothyroxine (SYNTHROID, LEVOTHROID) 75 MCG tablet Take 75 mcg by mouth daily before breakfast.  [provider]  rosuvastatin (CRESTOR) 10 MG tablet TAKE 1 TABLET BY MOUTH EVERY DAY 09/14/23   Jake Bathe, MD  tretinoin (RETIN-A) 0.025 % cream SMARTSIG:1 Sparingly Topical Every Night PRN 05/20/23   [provider]  Turmeric (QC TUMERIC COMPLEX PO) Take 1 capsule by mouth daily.    [provider]  Zinc 50 MG TABS Take by mouth.     [provider]    Physical Exam    Vital Signs:  Dawn Burns does not have vital signs available for review today.  Given telephonic nature of communication, physical exam is limited. AAOx3. NAD. Normal affect.  Speech and respirations are unlabored.  Accessory Clinical Findings    None  Assessment & Plan    1.  Preoperative Cardiovascular Risk Assessment: -Patient's RCRI score is 0.9%  The patient affirms she has been doing well without any new cardiac symptoms. They are able to achieve 7 METS without cardiac limitations. Therefore, based on ACC/AHA guidelines, the patient would be at acceptable risk for the planned procedure without further cardiovascular testing. The patient was advised that if she develops new symptoms prior to surgery to contact our office to arrange for a follow-up visit, and she verbalized understanding.   The patient was advised that if she develops new symptoms prior to surgery to contact our office to arrange for a follow-up visit, and she verbalized understanding.  A copy of this note will be routed to requesting surgeon.  Time:   Today, I have spent 6 minutes with the patient with telehealth technology discussing medical history, symptoms, and management plan.     Napoleon Form, Leodis Rains, NP  01/14/2024, 7:21 AM

## 2024-01-15 ENCOUNTER — Encounter: Payer: Self-pay | Admitting: Cardiology

## 2024-01-26 ENCOUNTER — Other Ambulatory Visit: Payer: Self-pay

## 2024-01-26 DIAGNOSIS — I2510 Atherosclerotic heart disease of native coronary artery without angina pectoris: Secondary | ICD-10-CM

## 2024-01-26 DIAGNOSIS — E7849 Other hyperlipidemia: Secondary | ICD-10-CM

## 2024-01-26 DIAGNOSIS — Z789 Other specified health status: Secondary | ICD-10-CM

## 2024-01-27 ENCOUNTER — Encounter: Payer: Self-pay | Admitting: Cardiology

## 2024-01-27 LAB — LIPID PANEL
Chol/HDL Ratio: 2.2 ratio (ref 0.0–4.4)
Cholesterol, Total: 170 mg/dL (ref 100–199)
HDL: 76 mg/dL (ref >39)
LDL Chol Calc (NIH): 77 mg/dL (ref 0–99)
Triglycerides: 92 mg/dL (ref 0–149)
VLDL Cholesterol Cal: 17 mg/dL (ref 5–40)

## 2024-01-27 LAB — HEPATIC FUNCTION PANEL
ALT: 29 IU/L (ref 0–32)
AST: 43 IU/L — ABNORMAL HIGH (ref 0–40)
Albumin: 4.6 g/dL (ref 3.9–4.9)
Alkaline Phosphatase: 136 IU/L — ABNORMAL HIGH (ref 44–121)
Bilirubin Total: 0.5 mg/dL (ref 0.0–1.2)
Bilirubin, Direct: 0.15 mg/dL (ref 0.00–0.40)
Total Protein: 7 g/dL (ref 6.0–8.5)

## 2024-01-28 ENCOUNTER — Other Ambulatory Visit: Payer: Self-pay | Admitting: *Deleted

## 2024-01-28 DIAGNOSIS — Z79899 Other long term (current) drug therapy: Secondary | ICD-10-CM

## 2024-01-28 DIAGNOSIS — I2510 Atherosclerotic heart disease of native coronary artery without angina pectoris: Secondary | ICD-10-CM

## 2024-01-28 MED ORDER — EZETIMIBE 10 MG PO TABS: 10.0000 mg | ORAL_TABLET | Freq: Every day | ORAL | 3 refills | Status: DC

## 2024-02-12 ENCOUNTER — Encounter: Payer: Self-pay | Admitting: Pulmonary Disease

## 2024-02-12 ENCOUNTER — Ambulatory Visit (INDEPENDENT_AMBULATORY_CARE_PROVIDER_SITE_OTHER): Admitting: Pulmonary Disease

## 2024-02-12 VITALS — BP 108/74 | HR 67 | Ht 62.5 in | Wt 114.0 lb

## 2024-02-12 DIAGNOSIS — M19012 Primary osteoarthritis, left shoulder: Secondary | ICD-10-CM | POA: Diagnosis not present

## 2024-02-12 DIAGNOSIS — J479 Bronchiectasis, uncomplicated: Secondary | ICD-10-CM

## 2024-02-12 DIAGNOSIS — R918 Other nonspecific abnormal finding of lung field: Secondary | ICD-10-CM

## 2024-02-12 NOTE — Progress Notes (Signed)
 "              Dawn Burns    [id number]    August 13, 1956  Primary Care Physician:Masneri, Clotilda HERO, DO (Inactive)  Referring Physician: Cindy Clotilda HERO, DO [address] [facility] [address] [place],  [place] [address]  Chief complaint: Follow-up for dyspnea, bronchiectasis   HPI: 68 y.o. who  has a past medical history of Arthritis and Graves disease.   Referred for evaluation of abnormal CT.  She had a CT scan of the chest as a follow-up for lung nodules which showed mild tree-in-bud nodularity, bronchiectasis and mild scarring.  She has occasional cough with mucus in the lung.  No dyspnea History notable for arthritis for which she saw Dr. Mai around 2020 and was told that she does not have any significant rheumatologic problem.  Pets: No pets.  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  No feather pillows or comforters Smoking history: Never smoker.  Exposed to secondhand smoke as a child Travel history: From [place] .  No significant travel history Relevant family history: No family history of lung disease  Interim history: Discussed the use of AI scribe software for clinical note transcription with the patient, who gave verbal consent to proceed.  History of Present Illness Dawn Burns is a 68 year old female who presents for pre-operative evaluation for shoulder replacement surgery.  She has a history of left shoulder pain that has progressively worsened over time. The pain is severe, extends down the arm, and significantly impacts her daily activities, including washing windows, mixing, or pushing a stroller. It also disrupts her sleep, causing significant discomfort. She has previously undergone rotator cuff surgery on the other shoulder. 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 She has been diagnosed with arthritis  in the shoulder, with the ball of the rotator cuff grinding.  In terms of her pulmonary history, she has been followed for mild changes observed on a CT scan, including bronchiectasis and nodules described as 'tree and bud,' which may indicate a chronic infection like Mycobacterium avium-intracellulare (MAI). These findings have been stable over the past year, with a CT scan in February showing minimal changes. Her lung function tests from August 2024 were normal, and she is not on oxygen therapy. She has never smoked and has no significant breathing issues currently.    Outpatient Encounter Medications as of 02/12/2024  Medication Sig   Ascorbic Acid (VITAMIN C) 500 MG CAPS    cholecalciferol (VITAMIN D) 1000 UNITS tablet Take 1,400 Units by mouth daily.   ezetimibe  (ZETIA ) 10 MG tablet Take 1 tablet (10 mg total) by mouth daily.   hydrocortisone  (ANUSOL -HC) 2.5 % rectal cream Place 1 Application rectally 2 (two) times daily.   hydrocortisone  2.5 % cream APPLY TO THE AFFECTED AREA ON THE FACE TWICE DAILY AS NEEDED   Iron, Ferrous Sulfate, 325 (65 Fe) MG TABS    levothyroxine (SYNTHROID, LEVOTHROID) 75 MCG tablet Take 75 mcg by mouth daily before breakfast.   rosuvastatin  (CRESTOR ) 10 MG tablet TAKE 1 TABLET BY MOUTH EVERY DAY   tretinoin (RETIN-A) 0.025 % cream SMARTSIG:1 Sparingly Topical Every Night PRN   Turmeric (QC TUMERIC COMPLEX PO) Take 1 capsule by mouth daily.   Zinc 50 MG TABS Take by mouth.   escitalopram (LEXAPRO) 10 MG tablet 1 tablet Orally  Once a day   levothyroxine (SYNTHROID) 50 MCG tablet Take by mouth once a week.   No facility-administered encounter medications on file as of 02/12/2024.    Physical Exam: Blood pressure 118/72, pulse 63, temperature 97.9 F (36.6 C), temperature source Oral, height 5' 2.5 (1.588 m), weight 116 lb (52.6 kg), SpO2 96 %. Gen:      No acute distress HEENT:  EOMI, sclera anicteric Neck:     No masses; no thyromegaly Lungs:    Clear to  auscultation bilaterally; normal respiratory effort CV:         Regular rate and rhythm; no murmurs Abd:      + bowel sounds; soft, non-tender; no palpable masses, no distension Ext:    No edema; adequate peripheral perfusion Skin:      Warm and dry; no rash Neuro: alert and oriented x 3 Psych: normal mood and affect  Data Reviewed: Imaging: Cardiac CT 04/27/2022-right lower lobe pulmonary nodule, mild bronchiectasis CT chest 12/03/2022-evolution of right lower lobe opacity into scar, bronchiectasis in the right lower lobe, right middle lobe with tree-in-bud CT high-resolution 11/30/2023-moderate patchy air trapping, mild patchy areas of bronchiectasis, tree-in-bud opacities. I had reviewed the images personally.  PFTs: 05/27/2023 FVC 2.21 [76%], FEV1 1.64 [95%], F/F75, TLC 4.22 [87%], DLCO 17.50 [93%] Normal test   Labs: 12/24/2022-CBC, IgE, quantitative immunoglobulin, cystic fibrosis, alpha-1 antitrypsin, rheumatoid factor and CCP-negative. Assessment & Plan Bronchiectasis with nodules Mild bronchiectasis with tree-in-bud nodules on CT scan. Possible chronic infection with Mycobacterium avium-intracellulare (MAI) considered, but not severe or progressive. February 2025 CT scan showed minimal changes with slight worsening of inflammation. Normal lung function tests in August 2024. Non-smoker. Low pulmonary risk for shoulder surgery. - Monitor bronchiectasis and nodules. - No treatment for MAI unless condition worsens. - No pulmonary contraindications for shoulder surgery.  Left shoulder arthritis with rotator cuff damage Severe left shoulder arthritis with rotator cuff damage causing significant pain and functional limitations. Scheduled for shoulder replacement surgery. Previous rotator cuff repair on the opposite shoulder. Surgery expected to improve quality of life.   Peri-operative Assessment of Pulmonary Risk for Non-Thoracic Surgery:  [name], risk of perioperative  pulmonary complications is increased by:  Age greater than 65 years   Generally at low risk of perioperative pulmonary complication, By ARISCAT criteria she is at low risk with estimated 1.6% pulmonary complication rate  Respiratory complications generally occur in 1% of ASA Class I patients, 5% of ASA Class II and 10% of ASA Class III-IV patients These complications rarely result in mortality and iclude postoperative pneumonia, atelectasis, pulmonary embolism, ARDS and increased time requiring postoperative mechanical ventilation.  Overall, I recommend proceeding with the surgery if the risk for respiratory complications are outweighed by the potential benefits. This will need to be discussed between the patient and surgeon.  To reduce risks of respiratory complications, I recommend: --Pre- and post-operative incentive spirometry performed frequently while awake --Avoiding use of pancuronium during anesthesia.  I have discussed the risk factors and recommendations above with the patient.   Plan/Recommendations: Cleared to proceed with shoulder surgery Follow-up in 6 months  Lonna Coder MD [facility] 02/12/2024, 10:35 AM  CC: Cindy Clotilda HERO, DO    "

## 2024-02-12 NOTE — Patient Instructions (Signed)
 VISIT SUMMARY:  You came in today for a pre-operative evaluation for your upcoming shoulder replacement surgery. We discussed your history of left shoulder pain, which has been getting worse and affecting your daily activities and sleep. We also reviewed your pulmonary history, including mild bronchiectasis and nodules, which have been stable.  YOUR PLAN:  -BRONCHIECTASIS WITH NODULES: Bronchiectasis is a condition where the airways in your lungs are damaged, leading to mucus build-up and infections. Your CT scan showed mild bronchiectasis with nodules, which might indicate a chronic infection. However, your condition is stable, and your lung function is normal. We will continue to monitor this, but no treatment is needed unless it worsens. There are no pulmonary issues that would prevent your shoulder surgery.  -LEFT SHOULDER ARTHRITIS WITH ROTATOR CUFF DAMAGE: Arthritis in your left shoulder, along with damage to the rotator cuff, is causing you significant pain and limiting your activities. Shoulder replacement surgery is planned to help improve your quality of life. Given your stable pulmonary condition, you are at low risk for complications from the surgery.  INSTRUCTIONS:  Please proceed with the scheduled shoulder replacement surgery. Continue to monitor your pulmonary condition, and if you notice any changes or worsening symptoms, contact us  immediately.

## 2024-02-15 ENCOUNTER — Telehealth: Payer: Self-pay | Admitting: Pulmonary Disease

## 2024-02-15 NOTE — Telephone Encounter (Signed)
 Fax received from Dr. Ellard Gunning  with left reversal shoulder arthroplasty under general anesthesia to perform a left reserve shoulder arthroplasty- under general anesthesia on patient.  Patient needs surgery clearance. Surgery is TBD. Patient was seen on 02/12/24. Office protocol is a risk assessment can be sent to surgeon if patient has been seen in 60 days or less.   Risk assessment was done at recent ov. This was faxed to [facility].

## 2024-02-17 NOTE — Telephone Encounter (Signed)
 Done- see phone note 02/15/24

## 2024-02-18 ENCOUNTER — Encounter: Payer: Self-pay | Admitting: Pulmonary Disease

## 2024-02-19 NOTE — Telephone Encounter (Signed)
Surgical clearance form.

## 2024-02-22 NOTE — Telephone Encounter (Unsigned)
 Copied from CRM [id number]. Topic: Clinical - Medical Advice >> Feb 19, 2024  1:16 PM Crist Dominion wrote: Reason for CRM: Patient is requesting an update on her surgical clearance from Dr. Waylan Haggard as it needs to be send to [facility].

## 2024-02-23 ENCOUNTER — Telehealth: Payer: Self-pay | Admitting: *Deleted

## 2024-02-23 NOTE — Telephone Encounter (Signed)
 Copied from CRM [id number]. Topic: General - Other >> Feb 19, 2024  9:14 AM Tyronne Galloway wrote: Reason for CRM: A risk assessment was completed for the pt at recent ov on 5/9 with Dr. Waylan Haggard and it was faxed to [facility] on 5/12, however Rice Chamorro from [facility] called this morning requesting the actual clearance letter to be marked as cleared to be faxed to [phone] if possible. Rice Chamorro stated if that was not possible, please advise that, and she would send over the risk assessment received to the surgeon.

## 2024-02-25 NOTE — Telephone Encounter (Signed)
 I called [facility] and notified scheduler that the office protocol is the risk assessment, and not signed clearance form. She will let the provider know. Nothing further needed.

## 2024-05-02 ENCOUNTER — Telehealth: Payer: Self-pay | Admitting: Pharmacist

## 2024-05-02 DIAGNOSIS — E7849 Other hyperlipidemia: Secondary | ICD-10-CM

## 2024-05-02 NOTE — Telephone Encounter (Signed)
 Zetia  were added to Crestor  10- f/u lab is due call to remind patient. Pt states she will go on Wednesday July 30.

## 2024-05-04 LAB — LIPID PANEL

## 2024-05-05 ENCOUNTER — Telehealth: Payer: Self-pay | Admitting: Pharmacist

## 2024-05-05 ENCOUNTER — Telehealth: Payer: Self-pay | Admitting: Pharmacy Technician

## 2024-05-05 ENCOUNTER — Other Ambulatory Visit (HOSPITAL_COMMUNITY): Payer: Self-pay

## 2024-05-05 ENCOUNTER — Ambulatory Visit: Payer: Self-pay | Admitting: Pharmacist

## 2024-05-05 LAB — HEPATIC FUNCTION PANEL
ALT: 54 IU/L — ABNORMAL HIGH (ref 0–32)
AST: 57 IU/L — ABNORMAL HIGH (ref 0–40)
Albumin: 4.6 g/dL (ref 3.9–4.9)
Alkaline Phosphatase: 145 IU/L — ABNORMAL HIGH (ref 44–121)
Bilirubin Total: 0.4 mg/dL (ref 0.0–1.2)
Bilirubin, Direct: 0.16 mg/dL (ref 0.00–0.40)
Total Protein: 7.1 g/dL (ref 6.0–8.5)

## 2024-05-05 LAB — LIPID PANEL
Cholesterol, Total: 163 mg/dL (ref 100–199)
HDL: 75 mg/dL (ref >39)
LDL CALC COMMENT:: 2.2 ratio (ref 0.0–4.4)
LDL Chol Calc (NIH): 75 mg/dL (ref 0–99)
Triglycerides: 66 mg/dL (ref 0–149)
VLDL Cholesterol Cal: 13 mg/dL (ref 5–40)

## 2024-05-05 MED ORDER — REPATHA SURECLICK 140 MG/ML ~~LOC~~ SOAJ: 140.0000 mg | SUBCUTANEOUS | 3 refills | Status: AC

## 2024-05-05 NOTE — Addendum Note (Signed)
 Addended by: Anahis Haff K on: 05/05/2024 03:30 PM   Modules accepted: Orders

## 2024-05-05 NOTE — Telephone Encounter (Signed)
 Pt made aware of approval, f/u lab is due around and of Oct. Pt advised to hold on to Crestor  and Zetia  due to elevated liver enzymes.

## 2024-05-05 NOTE — Telephone Encounter (Signed)
 LFT elevated LDL did not improve much by Zetia  addition to moderate intensity statins - pt confirms she does not drink alcohol and not on any other meds that affects LFTs . Will re-apply for Repatha  PA which was previously denied. Pt advised to hold statin and Zetia .

## 2024-05-05 NOTE — Telephone Encounter (Signed)
 Pharmacy Patient Advocate Encounter  Received notification from [facility] that Prior Authorization for repatha  has been APPROVED from 05/05/24 to 05/05/25. Ran test claim, Copay is $24.99. This test claim was processed through [facility]- copay amounts may vary at other pharmacies due to pharmacy/plan contracts, or as the patient moves through the different stages of their insurance plan.   PA #/Case ID/Reference #: [id number]

## 2024-05-05 NOTE — Telephone Encounter (Signed)
 For pt calls  Pharmacy Patient Advocate Encounter   Received notification from Pt Calls Messages that prior authorization for REPATHA  is required/requested.   Insurance verification completed.   The patient is insured through [facility] .   Per test claim: PA required; PA submitted to above mentioned insurance via LATENT Key/confirmation #/EOC [id number] Status is pending

## 2024-05-06 ENCOUNTER — Encounter: Payer: Self-pay | Admitting: Cardiology

## 2024-05-06 DIAGNOSIS — E7849 Other hyperlipidemia: Secondary | ICD-10-CM

## 2024-05-09 NOTE — Telephone Encounter (Signed)
 Spoke with the patient and addressed concerns regarding potential side effects of Repatha . It was agreed to hold all cholesterol-lowering medications for now. A repeat lipid panel and liver function tests (LFTs) will be obtained in 1 month to establish an updated baseline.

## 2024-06-03 NOTE — Telephone Encounter (Signed)
Repatha PA approved.

## 2024-06-10 ENCOUNTER — Ambulatory Visit: Payer: Self-pay | Admitting: Cardiology

## 2024-06-10 DIAGNOSIS — E7849 Other hyperlipidemia: Secondary | ICD-10-CM

## 2024-06-10 LAB — LIPID PANEL
Chol/HDL Ratio: 2.9 ratio (ref 0.0–4.4)
Cholesterol, Total: 202 mg/dL — ABNORMAL HIGH (ref 100–199)
HDL: 69 mg/dL (ref >39)
LDL Chol Calc (NIH): 117 mg/dL — ABNORMAL HIGH (ref 0–99)
Triglycerides: 89 mg/dL (ref 0–149)
VLDL Cholesterol Cal: 16 mg/dL (ref 5–40)

## 2024-06-17 LAB — HEPATIC FUNCTION PANEL
ALT: 39 IU/L — ABNORMAL HIGH (ref 0–32)
AST: 48 IU/L — ABNORMAL HIGH (ref 0–40)
Albumin: 4.8 g/dL (ref 3.9–4.9)
Alkaline Phosphatase: 145 IU/L — ABNORMAL HIGH (ref 44–121)
Bilirubin Total: 0.4 mg/dL (ref 0.0–1.2)
Bilirubin, Direct: 0.12 mg/dL (ref 0.00–0.40)
Total Protein: 7.2 g/dL (ref 6.0–8.5)

## 2024-06-17 LAB — LIPID PANEL
Chol/HDL Ratio: 3.1 ratio (ref 0.0–4.4)
Cholesterol, Total: 233 mg/dL — ABNORMAL HIGH (ref 100–199)
HDL: 75 mg/dL (ref >39)
LDL Chol Calc (NIH): 144 mg/dL — ABNORMAL HIGH (ref 0–99)
Triglycerides: 80 mg/dL (ref 0–149)
VLDL Cholesterol Cal: 14 mg/dL (ref 5–40)

## 2024-06-17 NOTE — Telephone Encounter (Signed)
 Spoke to patient, in agreement to start Repatha  140 mg every 14 days. Will repeat lab on Aug 22, 2024. LFT still elevated pt states she drinks special kind of mushroom coffee and takes bunch of herbal supplements advised to get off of those and follow up with PCP on LFT.

## 2024-06-20 ENCOUNTER — Encounter: Payer: Self-pay | Admitting: Cardiology

## 2024-08-22 LAB — LIPID PANEL
Chol/HDL Ratio: 2.7 ratio (ref 0.0–4.4)
Cholesterol, Total: 215 mg/dL — ABNORMAL HIGH (ref 100–199)
HDL: 79 mg/dL (ref >39)
LDL Chol Calc (NIH): 124 mg/dL — ABNORMAL HIGH (ref 0–99)
Triglycerides: 68 mg/dL (ref 0–149)
VLDL Cholesterol Cal: 12 mg/dL (ref 5–40)

## 2024-08-23 NOTE — Telephone Encounter (Signed)
 Result discussed other the phone. Patient want to continue with lifestyle. Will discuss with Dr.Skains at upcoming apt in 12/2024

## 2024-11-24 ENCOUNTER — Ambulatory Visit: Admitting: Cardiology

## 2025-02-13 ENCOUNTER — Ambulatory Visit: Admitting: Pulmonary Disease

## 2025-03-20 ENCOUNTER — Ambulatory Visit: Admitting: Pulmonary Disease
